# Patient Record
Sex: Female | Born: 1980 | Race: Black or African American | Hispanic: No | Marital: Single | Smoking: Current every day smoker
Health system: Southern US, Community
[De-identification: ages and names within clinical notes are randomized; demographics above are authoritative.]

## PROBLEM LIST (undated history)

## (undated) DIAGNOSIS — R519 Headache, unspecified: Secondary | ICD-10-CM

## (undated) HISTORY — DX: Headache, unspecified: R51.9

## (undated) HISTORY — PX: PANNICULECTOMY: SUR1001

---

## 2014-04-30 DIAGNOSIS — G4733 Obstructive sleep apnea (adult) (pediatric): Secondary | ICD-10-CM | POA: Insufficient documentation

## 2017-05-18 DIAGNOSIS — I10 Essential (primary) hypertension: Secondary | ICD-10-CM | POA: Insufficient documentation

## 2018-05-23 DIAGNOSIS — G43109 Migraine with aura, not intractable, without status migrainosus: Secondary | ICD-10-CM | POA: Insufficient documentation

## 2020-02-26 DIAGNOSIS — Z419 Encounter for procedure for purposes other than remedying health state, unspecified: Secondary | ICD-10-CM | POA: Diagnosis not present

## 2020-03-28 DIAGNOSIS — Z419 Encounter for procedure for purposes other than remedying health state, unspecified: Secondary | ICD-10-CM | POA: Diagnosis not present

## 2020-04-28 DIAGNOSIS — Z419 Encounter for procedure for purposes other than remedying health state, unspecified: Secondary | ICD-10-CM | POA: Diagnosis not present

## 2020-05-28 DIAGNOSIS — Z419 Encounter for procedure for purposes other than remedying health state, unspecified: Secondary | ICD-10-CM | POA: Diagnosis not present

## 2020-06-28 DIAGNOSIS — Z419 Encounter for procedure for purposes other than remedying health state, unspecified: Secondary | ICD-10-CM | POA: Diagnosis not present

## 2020-07-28 DIAGNOSIS — Z419 Encounter for procedure for purposes other than remedying health state, unspecified: Secondary | ICD-10-CM | POA: Diagnosis not present

## 2020-08-28 DIAGNOSIS — Z419 Encounter for procedure for purposes other than remedying health state, unspecified: Secondary | ICD-10-CM | POA: Diagnosis not present

## 2020-09-10 DIAGNOSIS — I10 Essential (primary) hypertension: Secondary | ICD-10-CM | POA: Diagnosis not present

## 2020-09-10 DIAGNOSIS — M170 Bilateral primary osteoarthritis of knee: Secondary | ICD-10-CM | POA: Diagnosis not present

## 2020-09-10 DIAGNOSIS — Z0000 Encounter for general adult medical examination without abnormal findings: Secondary | ICD-10-CM | POA: Diagnosis not present

## 2020-09-28 DIAGNOSIS — Z419 Encounter for procedure for purposes other than remedying health state, unspecified: Secondary | ICD-10-CM | POA: Diagnosis not present

## 2020-10-25 DIAGNOSIS — Z20822 Contact with and (suspected) exposure to covid-19: Secondary | ICD-10-CM | POA: Diagnosis not present

## 2020-10-25 DIAGNOSIS — Z01812 Encounter for preprocedural laboratory examination: Secondary | ICD-10-CM | POA: Diagnosis not present

## 2020-10-25 DIAGNOSIS — E65 Localized adiposity: Secondary | ICD-10-CM | POA: Diagnosis not present

## 2020-10-26 DIAGNOSIS — Z419 Encounter for procedure for purposes other than remedying health state, unspecified: Secondary | ICD-10-CM | POA: Diagnosis not present

## 2020-11-01 DIAGNOSIS — G4730 Sleep apnea, unspecified: Secondary | ICD-10-CM | POA: Diagnosis not present

## 2020-11-01 DIAGNOSIS — M549 Dorsalgia, unspecified: Secondary | ICD-10-CM | POA: Diagnosis not present

## 2020-11-01 DIAGNOSIS — R634 Abnormal weight loss: Secondary | ICD-10-CM | POA: Diagnosis not present

## 2020-11-01 DIAGNOSIS — E65 Localized adiposity: Secondary | ICD-10-CM | POA: Diagnosis not present

## 2020-11-01 DIAGNOSIS — M6208 Separation of muscle (nontraumatic), other site: Secondary | ICD-10-CM | POA: Diagnosis not present

## 2020-11-01 DIAGNOSIS — I10 Essential (primary) hypertension: Secondary | ICD-10-CM | POA: Diagnosis not present

## 2020-11-01 DIAGNOSIS — L987 Excessive and redundant skin and subcutaneous tissue: Secondary | ICD-10-CM | POA: Diagnosis not present

## 2020-11-02 DIAGNOSIS — R634 Abnormal weight loss: Secondary | ICD-10-CM | POA: Diagnosis not present

## 2020-11-02 DIAGNOSIS — G4730 Sleep apnea, unspecified: Secondary | ICD-10-CM | POA: Diagnosis not present

## 2020-11-02 DIAGNOSIS — M549 Dorsalgia, unspecified: Secondary | ICD-10-CM | POA: Diagnosis not present

## 2020-11-02 DIAGNOSIS — L987 Excessive and redundant skin and subcutaneous tissue: Secondary | ICD-10-CM | POA: Diagnosis not present

## 2020-11-26 DIAGNOSIS — Z419 Encounter for procedure for purposes other than remedying health state, unspecified: Secondary | ICD-10-CM | POA: Diagnosis not present

## 2020-12-26 DIAGNOSIS — Z419 Encounter for procedure for purposes other than remedying health state, unspecified: Secondary | ICD-10-CM | POA: Diagnosis not present

## 2021-01-26 DIAGNOSIS — Z419 Encounter for procedure for purposes other than remedying health state, unspecified: Secondary | ICD-10-CM | POA: Diagnosis not present

## 2021-02-25 DIAGNOSIS — Z419 Encounter for procedure for purposes other than remedying health state, unspecified: Secondary | ICD-10-CM | POA: Diagnosis not present

## 2021-03-28 DIAGNOSIS — Z419 Encounter for procedure for purposes other than remedying health state, unspecified: Secondary | ICD-10-CM | POA: Diagnosis not present

## 2021-04-28 DIAGNOSIS — Z419 Encounter for procedure for purposes other than remedying health state, unspecified: Secondary | ICD-10-CM | POA: Diagnosis not present

## 2021-05-28 DIAGNOSIS — Z419 Encounter for procedure for purposes other than remedying health state, unspecified: Secondary | ICD-10-CM | POA: Diagnosis not present

## 2021-06-28 DIAGNOSIS — Z419 Encounter for procedure for purposes other than remedying health state, unspecified: Secondary | ICD-10-CM | POA: Diagnosis not present

## 2021-07-28 DIAGNOSIS — Z419 Encounter for procedure for purposes other than remedying health state, unspecified: Secondary | ICD-10-CM | POA: Diagnosis not present

## 2021-08-28 DIAGNOSIS — Z419 Encounter for procedure for purposes other than remedying health state, unspecified: Secondary | ICD-10-CM | POA: Diagnosis not present

## 2021-09-28 DIAGNOSIS — Z419 Encounter for procedure for purposes other than remedying health state, unspecified: Secondary | ICD-10-CM | POA: Diagnosis not present

## 2021-10-26 DIAGNOSIS — Z419 Encounter for procedure for purposes other than remedying health state, unspecified: Secondary | ICD-10-CM | POA: Diagnosis not present

## 2021-11-01 ENCOUNTER — Ambulatory Visit (INDEPENDENT_AMBULATORY_CARE_PROVIDER_SITE_OTHER): Payer: Medicaid Other | Admitting: Advanced Practice Midwife

## 2021-11-01 ENCOUNTER — Other Ambulatory Visit: Payer: Self-pay

## 2021-11-01 VITALS — BP 134/89 | HR 72 | Wt 197.7 lb

## 2021-11-01 DIAGNOSIS — Z3200 Encounter for pregnancy test, result unknown: Secondary | ICD-10-CM

## 2021-11-01 DIAGNOSIS — Z3201 Encounter for pregnancy test, result positive: Secondary | ICD-10-CM | POA: Diagnosis not present

## 2021-11-01 DIAGNOSIS — Z3490 Encounter for supervision of normal pregnancy, unspecified, unspecified trimester: Secondary | ICD-10-CM

## 2021-11-01 DIAGNOSIS — Z3687 Encounter for antenatal screening for uncertain dates: Secondary | ICD-10-CM

## 2021-11-01 LAB — POCT PREGNANCY, URINE: Preg Test, Ur: POSITIVE — AB

## 2021-11-01 NOTE — Progress Notes (Signed)
?  History:  ?[name] is a 41 y.o. [id number] who presents to clinic today with complaint of possible pregnancy.  This is a big surprise and she is intermittently tearful during her visit. She denies physical complaints or concerns. She intends to pursue prenatal care with [facility]. ? ?The following portions of the patient's history were reviewed and updated as appropriate: allergies, current medications, past family history, past medical history, past social history, past surgical history and problem list.  ? ?Review of Systems:  ?Pertinent items noted in HPI and remainder of comprehensive ROS otherwise negative. ? ?Objective:  ?Physical Exam ?BP 134/89   Pulse 72   Wt 197 lb 11.2 oz (89.7 kg)   LMP 08/27/2021 (Approximate)  ?Physical Exam ?Vitals and nursing note reviewed. Exam conducted with a chaperone present.  ?Constitutional:   ?   Appearance: Normal appearance.  ?Cardiovascular:  ?   Rate and Rhythm: Normal rate.  ?Pulmonary:  ?   Effort: Pulmonary effort is normal.  ?Neurological:  ?   Mental Status: She is alert.  ? ?Labs and Imaging ?Results for orders placed or performed in visit on 11/01/21 (from the past 24 hour(s))  ?Pregnancy, urine POC     Status: Abnormal  ? Collection Time: 11/01/21 10:02 AM  ?Result Value Ref Range  ? Preg Test, Ur POSITIVE (A) NEGATIVE  ? ? ?No results found. ? ? ?Assessment & Plan:  ?1. Pregnancy with uncertain dates, antepartum ?- No complaints or concerns ?- US OB LESS THAN 14 WEEKS WITH OB TRANSVAGINAL; Future ? ?2. Possible pregnancy ?- Positive UPT ?- Pregnancy, urine POC ? ? ? ?Approximately 15 minutes of total time was spent with this patient on history taking, coordination of care, education and documentation.  ? ?Clayton Bibles, MSA, MSN, CNM ?Certified Nurse Midwife, Faculty Practice ?Center for [employer], [facility] ? ?

## 2021-11-01 NOTE — Patient Instructions (Signed)

## 2021-11-01 NOTE — Progress Notes (Signed)
Possible Pregnancy ? ?Here today for pregnancy confirmation. UPT in office today is positive. Pt reports first positive home UPT about 1 week ago. Reviewed dating with patient. Reports UPT end of December, cannot recall specific date. Based on approx LMP, pt would be approx 9 w today. Dating ultrasound scheduled for 11/17/21. ? ?OB history reviewed; single, living c-section delivery 01/30/14, 2 prior miscarriages. Reviewed medications and allergies with patient; list of medications safe to take during pregnancy given. Patient to schedule new OB appts during check out. Will schedule to follow dating [place], will reschedule appts if patient is too early for new OB appts. Jeronimo Greaves, CNM to bedside for brief visit. ? ?Annabell Howells, RN ?11/01/2021  10:05 AM ? ?

## 2021-11-17 ENCOUNTER — Ambulatory Visit
Admission: RE | Admit: 2021-11-17 | Discharge: 2021-11-17 | Disposition: A | Discharge status: Home / Self Care | Payer: Medicaid Other | Source: Ambulatory Visit | Attending: Advanced Practice Midwife | Admitting: Advanced Practice Midwife

## 2021-11-17 ENCOUNTER — Telehealth: Payer: Self-pay

## 2021-11-17 ENCOUNTER — Other Ambulatory Visit: Payer: Self-pay | Admitting: Advanced Practice Midwife

## 2021-11-17 ENCOUNTER — Ambulatory Visit (INDEPENDENT_AMBULATORY_CARE_PROVIDER_SITE_OTHER): Payer: Medicaid Other | Admitting: *Deleted

## 2021-11-17 ENCOUNTER — Telehealth: Payer: Self-pay | Admitting: Advanced Practice Midwife

## 2021-11-17 ENCOUNTER — Other Ambulatory Visit: Payer: Self-pay

## 2021-11-17 ENCOUNTER — Encounter: Payer: Self-pay | Admitting: Obstetrics and Gynecology

## 2021-11-17 VITALS — BP 177/104 | HR 73 | Ht 62.0 in | Wt 199.7 lb

## 2021-11-17 DIAGNOSIS — O208 Other hemorrhage in early pregnancy: Secondary | ICD-10-CM | POA: Insufficient documentation

## 2021-11-17 DIAGNOSIS — O169 Unspecified maternal hypertension, unspecified trimester: Secondary | ICD-10-CM

## 2021-11-17 DIAGNOSIS — Z3687 Encounter for antenatal screening for uncertain dates: Secondary | ICD-10-CM | POA: Insufficient documentation

## 2021-11-17 DIAGNOSIS — Z3A09 9 weeks gestation of pregnancy: Secondary | ICD-10-CM | POA: Diagnosis not present

## 2021-11-17 DIAGNOSIS — Z3490 Encounter for supervision of normal pregnancy, unspecified, unspecified trimester: Secondary | ICD-10-CM

## 2021-11-17 MED ORDER — NIFEDIPINE ER OSMOTIC RELEASE 30 MG PO TB24: 30.0000 mg | ORAL_TABLET | Freq: Every day | ORAL | 0 refills | Status: DC

## 2021-11-17 NOTE — Progress Notes (Signed)
Here for nurse visit for [place] results. Reviewed [place] with Dr. Shawnie Pons and informed patient US shows live baby with EDD 06/18/22. Also informed patient shows small subchorionic hemorrhage and she may see spotting, this is associated with higher risk of miscarriage. Also informed her FHR 197 and not sure why, higher than normal. She reports she was very nervous in [place] and thinks her heart rate was elevated. Advised to start prenatal care asap, and to start prenatal vitamins. List of providers placed in summary. ?BP elevated today 158/103. Denies hx HTN, states BP always elevated, always gets anxious in doctors office. Repeat BP 177/104. I asked if she has been having headaches, only once lately and was yesterday and it went away on its own. ?Discussed BP's and assessment with Dr. Alvester Morin. Orders for procardia and bp check one week. Advised patient and patient teary. Support given and discussed risks of HTN to her and her baby. Advised since she has new ob intake scheduled 11/24/21 we can change to in person so we can do bp recheck. She voices understanding. ?Nancy Fetter ?

## 2021-11-17 NOTE — Telephone Encounter (Signed)
Patient called at home, identity confirmed x 2. Reviewed ultrasound results including dating, fetal tachycardia, subchorionic hemorrhage and indication for pelvic rest. Patient denies questions or concerns at end of call. Virtual New OB intake 03/30 ? ?Clayton Bibles, MSA, MSN, CNM ?Certified Nurse Midwife, Faculty Practice ?Center for [employer], [facility] ? ?

## 2021-11-17 NOTE — Patient Instructions (Signed)
Prenatal Care Providers           [facility]  [address] [phone]  [facility]   [address]  [phone]  [facility] [place]       [address] [phone]            [facility] [place]     [address] [phone]          [facility] [place]   [address] [phone]  [facility]  [address] [phone]     [facility]  [address]   [phone]     [facility]  Phone: [phone]  [facility]  Phone: [phone]  [facility] Phone: [phone]  [facility] Phone: [phone]  [facility] Phone: [phone]  [facility] Phone: [phone]  [facility]  Phone: [phone]  

## 2021-11-17 NOTE — Telephone Encounter (Signed)
Returned Call to [facility] regarding Receiving her Stat U/S REPORT. ?

## 2021-11-24 ENCOUNTER — Ambulatory Visit (INDEPENDENT_AMBULATORY_CARE_PROVIDER_SITE_OTHER): Payer: Medicaid Other

## 2021-11-24 ENCOUNTER — Ambulatory Visit: Payer: Medicaid Other

## 2021-11-24 VITALS — BP 121/84 | HR 98 | Wt 198.8 lb

## 2021-11-24 DIAGNOSIS — Z0130 Encounter for examination of blood pressure without abnormal findings: Secondary | ICD-10-CM

## 2021-11-24 NOTE — Progress Notes (Signed)
Pt here today for BP check after starting Procardia 30 mg po daily for elevated pressures at nurse visit on 11/17/21.   Pt denies headache and visual changes. Last dose of Procardia was last night.  BP LA 121/84.  Pt advised to continue to take her Procardia as prescribed, to call the office with concerns/questions, and that we will evaluate her at NEW OB intake on 12/08/21.  Pt verbalized understanding with no further questions.  ? ?Luisangel Wainright,RN  ?11/25/21 ?

## 2021-11-26 DIAGNOSIS — Z419 Encounter for procedure for purposes other than remedying health state, unspecified: Secondary | ICD-10-CM | POA: Diagnosis not present

## 2021-12-01 ENCOUNTER — Encounter: Payer: Self-pay | Admitting: Obstetrics and Gynecology

## 2021-12-08 ENCOUNTER — Other Ambulatory Visit (HOSPITAL_COMMUNITY)
Admission: RE | Admit: 2021-12-08 | Discharge: 2021-12-08 | Disposition: A | Discharge status: Home / Self Care | Payer: Medicaid Other | Source: Ambulatory Visit | Attending: Family Medicine | Admitting: Family Medicine

## 2021-12-08 ENCOUNTER — Ambulatory Visit (INDEPENDENT_AMBULATORY_CARE_PROVIDER_SITE_OTHER): Payer: Medicaid Other

## 2021-12-08 DIAGNOSIS — I10 Essential (primary) hypertension: Secondary | ICD-10-CM

## 2021-12-08 DIAGNOSIS — O0990 Supervision of high risk pregnancy, unspecified, unspecified trimester: Secondary | ICD-10-CM | POA: Diagnosis not present

## 2021-12-08 DIAGNOSIS — O09529 Supervision of elderly multigravida, unspecified trimester: Secondary | ICD-10-CM | POA: Insufficient documentation

## 2021-12-08 DIAGNOSIS — O10919 Unspecified pre-existing hypertension complicating pregnancy, unspecified trimester: Secondary | ICD-10-CM | POA: Insufficient documentation

## 2021-12-08 DIAGNOSIS — O09521 Supervision of elderly multigravida, first trimester: Secondary | ICD-10-CM

## 2021-12-08 NOTE — Progress Notes (Signed)
Today Pt BP readings : ?142/95-R ?132/91-L ?Pt currently taking Nifedipine-30 mg, last taken last night.Spoke with Ralene Bathe, RN and Diane Day, RN regarding readings and they stated due to 2nd reading being lower, pt should be fine to waite until New OB to get checked again. ?

## 2021-12-08 NOTE — Patient Instructions (Addendum)
AREA PEDIATRIC/FAMILY PRACTICE PHYSICIANS ? ?[place] ([address]) ?[facility] ?Chambliss, MD; Eniola, MD; Hale, MD; Hensel, MD; McDiarmid, MD; McIntyer, MD; Tanina Barb, MD; Walden, MD ?[address] [place], [place] [address] ?[phone] ?Mon-Fri 8:30-12:30, 1:30-5:00 ?Providers come to see babies at [facility] ?Accepting Medicaid ?[facility] ?Limited providers who accept newborns: Koirala, MD; Morrow, MD; Wolters, MD ?[address], [place], [place] [address] ?[phone] ?Mon-Fri 8:00-5:30 ?Babies seen by providers at [facility] ?Does NOT accept Medicaid ?Please call early in hospitalization for appointment (limited availability)  ?[facility] ?Mulberry, MD ?[address] [place], [place] [address] ?[phone] ?Mon, Tue, Thur, Fri 8:30-5:00, Wed 10:00-7:00 (closed 1-2pm) ?Babies seen by [facility] providers ?Accepting Medicaid ?[facility] ?Rubin, MD ?[address], [place], [place] [address] ?[phone] ?Mon-Fri 8:30-5:00, Sat 8:30-12:00 ?Provider comes to see babies at [facility] ?Accepting Medicaid ?Must have been referred from current patients or contacted office prior to delivery ?[facility] ([facility]) ?Brown, MD; Chandler, MD; Ettefagh, MD; Grant, MD; Lester, MD; McCormick, MD; McQueen, MD; Prose, MD; Simha, MD; Stanley, MD; Stryffeler, NP; Tebben, NP ?[address], [place], [place] [address] ?[phone] ?Mon, Tue, Thur, Fri 8:30-5:30, Wed 9:30-5:30, Sat 8:30-12:30 ?Babies seen by [facility] providers ?Accepting Medicaid ?Only accepting infants of first-time parents or siblings of current patients ?Hospital discharge coordinator will make follow-up appointment ?Jack Amos ?[address], [place], [place]  [address] ?[phone]   Fax - [phone] ?[facility] ?[address], [place], [place]  [address] ?Phone - [phone]   Fax - [phone] ?Shilpa Gosrani ?[address], [place], [place]  [address] ?[phone] ? ?[place] ([address]) ? [facility] ?Bates, MD; [place], MD; Cooper, Cox, MD; MD; Davis, MD; Dovico, MD; Ettefaugh, MD; Little, MD; Lowe, MD; Keiffer, MD; Melvin, MD; Sumner, MD; Williams, MD ?[address], [place], [place] [address] ?[phone] ?Mon-Fri 8:30-5:00 (extended evenings Mon-Thur as needed), Sat-Sun 10:00-1:00 ?Providers come to see babies at [facility] ?Accepting Medicaid for families of first-time babies and families with all children in the household age 3 and under. Must register with office prior to making appointment (M-F only). ?[facility] ?Henson, NP; Knapp, MD; Lalonde, MD; Tysinger, PA ?[address] [place], [place] [address] ?[phone] ?Mon-Fri 8:00-5:00 ?Babies seen by providers at [facility] ?Does NOT accept Medicaid/Commercial Insurance Only ?[facility] ([facility])  ?Artis, MD; Barnes, MD; Bratton, MD; Coccaro, MD; Lockett Gardner, MD; Kramer, MD; Marshall, MD; Netherton, MD; Poleto, MD; Skinner, MD ?[address] [place], [place] [address] ?[phone] ?Mon-Fri 8:30-5:30, Sat (Oct.-Mar.) 9:00-1:00 ?Babies seen by providers at [facility] ?Accepting Medicaid ? ?[place] ([address]) ?[facility] ?Reid, MD; Warner, MD ?[address], [place],  [address] ?[phone] ?Mon-Fri 8:30-5:00, Sat 8:30-12:00 ?Providers come to see babies at [facility] ?Does NOT accept Medicaid ?[facility] ?Becker, PA; Hagler, MD; Scifres, PA; Sun, MD; Swayne, MD ?[address], ,  [address] ?[phone] ?Mon-Fri 8:00-5:00 ?Babies seen by providers at [facility] ?Does NOT accept Medicaid ?Only accepting babies of parents who  are patients ?Please call early in hospitalization for appointment (limited availability) ? [facility] ?Clark, MD; Frye, MD; Kelleher, MD; Mack, NP; Miller, MD; O'Keller, MD; Patterson, NP; Pudlo, MD; Puzio, MD; Thomas, MD; Tucker, MD; Twiselton, MD ?[address], [place], [place] [address] ?[phone] ?Mon-Fri 8:00-5:00, Sat 9:00-12:00 ?Providers come to see babies at [facility] ?Does NOT accept Medicaid ? ?[place] ([address]) ?[facility] ?Limited providers accepting new patients: Brake, NP; Wharton, PA ?[address], [place], [place] [address] ?[phone] ?Mon-Fri 8:00-5:00 ?Babies seen by providers at [facility] ?Does NOT accept Medicaid ?Only accepting babies of parents who are patients ?Please call early in hospitalization for appointment (limited availability) ?[facility] ?Gay, MD; Quinlan, MD ?[address] [place], [place] [address] ?[phone] (press [phone] to schedule appointment) ?Mon-Fri 8:00-5:00 ?Providers come to see babies at [facility] ?Does NOT accept Medicaid ?[facility] ?Mazer, MD ?[address] [place], [place] [address] ?[phone] ?Mon-Fri 8:30-5:00 (lunch 12:30-1:00), extended hours by appointment only Wed 5:00-6:30 ?Babies seen by [facility] providers ?Accepting Medicaid ?[facility] ?Banks, MD; Jordan, MD; Koberlein, MD ?[address], [place], [place] [address] ?[phone] ?Mon-Fri 8:00-5:00 ?Babies seen by [facility] providers ?Does NOT accept Medicaid ?[facility] ?Parker, MD; Hunter, MD; Wallace, DO ?[address] [place], [place] [address] ?[phone] ?Mon-Fri 8:00-5:00 ?Babies seen by [facility] providers ?Does NOT accept Medicaid ?[facility] ?Brandon, PA; Brecken, PA; Christy, NP; Dees, MD; DeClaire, MD; DeWeese, MD; Hansen, NP; Mills, NP; Parrish, NP; Smoot, NP; Summer, MD; Vapne,  MD ?[address] [place], [place] [address] ?[phone] ?Mon-Fri 8:30-5:00, Sat 10:00-1:00 ?Providers come to see babies at [facility] ?Does NOT accept Medicaid ?Free prenatal information session Tuesdays at 4:45pm ?[facility] ?Bouska, MD; Gordon, PA; Jeffery, PA; Weber, PA ?[address] [place] [address] ?[phone] ?Mon-Fri 7:30-5:30 ?Babies seen by [facility] providers ?[facility] ?[address], [place], [place]  [address] ?[phone]   Fax - [phone] ? ?[place] ([address]) ?[facility] ?Reese, MD ?[address] [place], [place] [address] ?[phone] ?Mon-Thur 8:00-6:00 ?Providers come to see babies at [facility] ?Accepting Medicaid ?[facility] ?Anderson, NP; Badger, MD; Beal, PA; Spencer, PA ?[address] [place], [place] [address] ?[phone] ?Mon-Thur 7:30-7:30, Fri 7:30-4:30 ?Babies seen by [facility] providers ?Accepting Medicaid ?[facility] ?Agbuya, MD; Klett, NP; Romgoolam, MD ?[address], [place], [place] [address] ?[phone] ?Mon-Fri 8:30-5:00, Sat 8:30-12:00 ?Providers come to see babies at [facility] ?Accepting Medicaid ?Must have ?Meet & Greet? appointment at office prior to delivery ?[facility] ([facility]) ?McCord, MD; Wallace, MD; Wood, MD ?[address], [place], [place] [address] ?[phone] ?Mon-Wed 8:00-6:00, Thur-Fri 8:00-5:00, Sat 9:00-12:00 ?Providers come to see babies at [facility] ?Does NOT accept Medicaid ?Only accepting siblings of current patients ?[facility]  ?[address], [place], [place]  [address] ?[phone]   Fax - [phone] ?[facility] ?[address], [place],   [address] ?[phone]   Fax -  [phone] ? ?[place] ([address]) ? [facility] ?Cirigliano, DO; Matthews, DO ?[address] [place],  [address] ?[phone] ?Mon-Fri 7:00-5:00 ?Babies seen by [place]

## 2021-12-08 NOTE — Assessment & Plan Note (Deleted)
Nifedipine 30 mg

## 2021-12-08 NOTE — Progress Notes (Signed)
New OB Intake ? ?I connected with  Rutherford Limerick on 12/08/21 at  9:15 AM EDT by In Person Visit and verified that I am speaking with the correct person using two identifiers. Nurse is located at [facility] and pt is located at [employer]. ? ?I discussed the limitations, risks, security and privacy concerns of performing an evaluation and management service by telephone and the availability of in person appointments. I also discussed with the patient that there may be a patient responsible charge related to this service. The patient expressed understanding and agreed to proceed. ? ?I explained I am completing New OB Intake today. We discussed her EDD of 06/18/22 that is based on U/S on 11/17/21. Pt is G4/P1. I reviewed her allergies, medications, Medical/Surgical/OB history, and appropriate screenings. I informed her of [facility] services. Based on history, this is a/an  pregnancy uncomplicated .  ? ?Patient Active Problem List  ? Diagnosis Date Noted  ? Supervision of high risk pregnancy, antepartum 12/08/2021  ? AMA (advanced maternal age) multigravida 35+ 12/08/2021  ? Migraine with aura and without status migrainosus, not intractable 05/23/2018  ? Benign essential hypertension 05/18/2017  ? OSA (obstructive sleep apnea) 04/30/2014  ? ? ?Concerns addressed today ? ?Delivery Plans:  ?Plans to deliver at [facility].  ? ?MyChart/Babyscripts ?MyChart access verified. I explained pt will have some visits in office and some virtually. Babyscripts instructions given and order placed. Patient verifies receipt of registration text/e-mail. Account successfully created and app downloaded. ? ?Blood Pressure Cuff  ?Has own BP Cuff, Explained after first prenatal appt pt will check weekly and document in Babyscripts. ? ?Weight scale: Patient does / does not  have weight scale. Weight scale ordered for patient to pick up from [facility].  ? ?Anatomy US ?Explained first scheduled [place] will be around 19 weeks. Anatomy US scheduled for 01/24/22  at 0930. Pt notified to arrive at 0915. ?Scheduled AFP lab only appointment if CenteringPregnancy pt for same day as anatomy US.  ? ?Labs ?Discussed Avelina Laine genetic screening with patient. Would like both Panorama and Horizon drawn at new OB visit.Also if interested in genetic testing, tell patient she will need AFP 15-21 weeks to complete genetic testing .Routine prenatal labs needed. ? ?Covid Vaccine ?Patient has not covid vaccine.  ? ?Is patient a CenteringPregnancy candidate? Declined due to Due to work schedule   "Centering Patient" indicated on sticky note ?  ?Is patient a Mom+Baby Combined Care candidate? Not a candidate   Scheduled with Mom+Baby provider  ?  ?Is patient interested in [place]? No  "Interested in [employer] - Schedule next visit with CNM" on sticky note ? ?Informed patient of [facility] website  and placed link in her AVS.  ? ?Social Determinants of Health ?Food Insecurity: Patient denies food insecurity. ?WIC Referral: Patient is interested in referral to [facility].  ?Transportation: Patient denies transportation needs. ?Childcare: Discussed no children allowed at ultrasound appointments. Offered childcare services; patient declines childcare services at this time. ? ?Send link to Pregnancy Navigators ? ? ?Placed OB Box on problem list and updated ? ?First visit review ?I reviewed new OB appt with pt. I explained she will have a pelvic exam, ob bloodwork with genetic screening, and PAP smear. Explained pt will be seen by Dr. Crissie Reese at first visit; encounter routed to appropriate provider. Explained that patient will be seen by pregnancy navigator following visit with provider. [facility] information placed in AVS.  ? ?Henrietta Dine, CMA ?12/08/2021  9:43 AM  ?

## 2021-12-09 LAB — CBC/D/PLT+RPR+RH+ABO+RUBIGG...
Antibody Screen: NEGATIVE
Basophils Absolute: 0.1 10*3/uL (ref 0.0–0.2)
Basos: 1 %
EOS (ABSOLUTE): 0.2 10*3/uL (ref 0.0–0.4)
Eos: 2 %
HCV Ab: NONREACTIVE
HIV Screen 4th Generation wRfx: NONREACTIVE
Hematocrit: 40.4 % (ref 34.0–46.6)
Hemoglobin: 13.4 g/dL (ref 11.1–15.9)
Hepatitis B Surface Ag: NEGATIVE
Immature Grans (Abs): 0 10*3/uL (ref 0.0–0.1)
Immature Granulocytes: 0 %
Lymphocytes Absolute: 2 10*3/uL (ref 0.7–3.1)
Lymphs: 28 %
MCH: 29.5 pg (ref 26.6–33.0)
MCHC: 33.2 g/dL (ref 31.5–35.7)
MCV: 89 fL (ref 79–97)
Monocytes Absolute: 0.9 10*3/uL (ref 0.1–0.9)
Monocytes: 12 %
Neutrophils Absolute: 4.2 10*3/uL (ref 1.4–7.0)
Neutrophils: 57 %
Platelets: 301 10*3/uL (ref 150–450)
RBC: 4.54 x10E6/uL (ref 3.77–5.28)
RDW: 14.5 % (ref 11.7–15.4)
RPR Ser Ql: NONREACTIVE
Rh Factor: POSITIVE
Rubella Antibodies, IGG: 2.31 index (ref >0.99)
WBC: 7.3 10*3/uL (ref 3.4–10.8)

## 2021-12-09 LAB — GC/CHLAMYDIA PROBE AMP (~~LOC~~) NOT AT ARMC
Chlamydia: NEGATIVE
Comment: NEGATIVE
Comment: NORMAL
Neisseria Gonorrhea: NEGATIVE

## 2021-12-09 LAB — TSH: TSH: 0.951 u[IU]/mL (ref 0.450–4.500)

## 2021-12-09 LAB — HEMOGLOBIN A1C
Est. average glucose Bld gHb Est-mCnc: 111 mg/dL
Hgb A1c MFr Bld: 5.5 % (ref 4.8–5.6)

## 2021-12-09 LAB — HCV INTERPRETATION

## 2021-12-10 LAB — PROTEIN / CREATININE RATIO, URINE
Creatinine, Urine: 82.9 mg/dL
Protein, Ur: 9.1 mg/dL
Protein/Creat Ratio: 110 mg/g creat (ref 0–200)

## 2021-12-10 LAB — CULTURE, OB URINE

## 2021-12-10 LAB — URINE CULTURE, OB REFLEX

## 2021-12-13 ENCOUNTER — Encounter: Payer: Self-pay | Admitting: Family Medicine

## 2021-12-19 ENCOUNTER — Ambulatory Visit (INDEPENDENT_AMBULATORY_CARE_PROVIDER_SITE_OTHER): Payer: Medicaid Other | Admitting: Family Medicine

## 2021-12-19 ENCOUNTER — Encounter: Payer: Self-pay | Admitting: Family Medicine

## 2021-12-19 VITALS — BP 127/88 | HR 90 | Wt 205.5 lb

## 2021-12-19 DIAGNOSIS — O0990 Supervision of high risk pregnancy, unspecified, unspecified trimester: Secondary | ICD-10-CM

## 2021-12-19 DIAGNOSIS — O10919 Unspecified pre-existing hypertension complicating pregnancy, unspecified trimester: Secondary | ICD-10-CM

## 2021-12-19 DIAGNOSIS — Z3A14 14 weeks gestation of pregnancy: Secondary | ICD-10-CM

## 2021-12-19 DIAGNOSIS — O09522 Supervision of elderly multigravida, second trimester: Secondary | ICD-10-CM

## 2021-12-19 DIAGNOSIS — O169 Unspecified maternal hypertension, unspecified trimester: Secondary | ICD-10-CM

## 2021-12-19 DIAGNOSIS — Z98891 History of uterine scar from previous surgery: Secondary | ICD-10-CM

## 2021-12-19 DIAGNOSIS — G43109 Migraine with aura, not intractable, without status migrainosus: Secondary | ICD-10-CM

## 2021-12-19 MED ORDER — NIFEDIPINE ER OSMOTIC RELEASE 30 MG PO TB24: 30.0000 mg | ORAL_TABLET | Freq: Every day | ORAL | 11 refills | Status: DC

## 2021-12-19 MED ORDER — ASPIRIN EC 81 MG PO TBEC: 81.0000 mg | DELAYED_RELEASE_TABLET | Freq: Every day | ORAL | 11 refills | Status: DC

## 2021-12-19 NOTE — Patient Instructions (Signed)
Second Trimester of Pregnancy ? ?The second trimester of pregnancy is from week 13 through week 27. This is months 4 through 6 of pregnancy. The second trimester is often a time when you feel your best. Your body has adjusted to being pregnant, and you begin to feel better physically. ?During the second trimester: ?Morning sickness has lessened or stopped completely. ?You may have more energy. ?You may have an increase in appetite. ?The second trimester is also a time when the unborn baby (fetus) is growing rapidly. At the end of the sixth month, the fetus may be up to 12 inches long and weigh about 1? pounds. You will likely begin to feel the baby move (quickening) between 16 and 20 weeks of pregnancy. ?Body changes during your second trimester ?Your body continues to go through many changes during your second trimester. The changes vary and generally return to normal after the baby is born. ?Physical changes ?Your weight will continue to increase. You will notice your lower abdomen bulging out. ?You may begin to get stretch marks on your hips, abdomen, and breasts. ?Your breasts will continue to grow and to become tender. ?Dark spots or blotches (chloasma or mask of pregnancy) may develop on your face. ?A dark line from your belly button to the pubic area (linea nigra) may appear. ?You may have changes in your hair. These can include thickening of your hair, rapid growth, and changes in texture. Some people also have hair loss during or after pregnancy, or hair that feels dry or thin. ?Health changes ?You may develop headaches. ?You may have heartburn. ?You may develop constipation. ?You may develop hemorrhoids or swollen, bulging veins (varicose veins). ?Your gums may bleed and may be sensitive to brushing and flossing. ?You may urinate more often because the fetus is pressing on your bladder. ?You may have back pain. This is caused by: ?Weight gain. ?Pregnancy hormones that are relaxing the joints in your  pelvis. ?A shift in weight and the muscles that support your balance. ?Follow these instructions at home: ?Medicines ?Follow your health care provider's instructions regarding medicine use. Specific medicines may be either safe or unsafe to take during pregnancy. Do not take any medicines unless approved by your health care provider. ?Take a prenatal vitamin that contains at least 600 micrograms (mcg) of folic acid. ?Eating and drinking ?Eat a healthy diet that includes fresh fruits and vegetables, whole grains, good sources of protein such as meat, eggs, or tofu, and low-fat dairy products. ?Avoid raw meat and unpasteurized juice, milk, and cheese. These carry germs that can harm you and your baby. ?You may need to take these actions to prevent or treat constipation: ?Drink enough fluid to keep your urine pale yellow. ?Eat foods that are high in fiber, such as beans, whole grains, and fresh fruits and vegetables. ?Limit foods that are high in fat and processed sugars, such as fried or sweet foods. ?Activity ?Exercise only as directed by your health care provider. Most people can continue their usual exercise routine during pregnancy. Try to exercise for 30 minutes at least 5 days a week. Stop exercising if you develop contractions in your uterus. ?Stop exercising if you develop pain or cramping in the lower abdomen or lower back. ?Avoid exercising if it is very hot or humid or if you are at a high altitude. ?Avoid heavy lifting. ?If you choose to, you may have sex unless your health care provider tells you not to. ?Relieving pain and discomfort ?Wear a supportive   bra to prevent discomfort from breast tenderness. ?Take warm sitz baths to soothe any pain or discomfort caused by hemorrhoids. Use hemorrhoid cream if your health care provider approves. ?Rest with your legs raised (elevated) if you have leg cramps or low back pain. ?If you develop varicose veins: ?Wear support hose as told by your health care  provider. ?Elevate your feet for 15 minutes, 3-4 times a day. ?Limit salt in your diet. ?Safety ?Wear your seat belt at all times when driving or riding in a car. ?Talk with your health care provider if someone is verbally or physically abusive to you. ?Lifestyle ?Do not use hot tubs, steam rooms, or saunas. ?Do not douche. Do not use tampons or scented sanitary pads. ?Avoid cat litter boxes and soil used by cats. These carry germs that can cause birth defects in the baby and possibly loss of the fetus by miscarriage or stillbirth. ?Do not use herbal remedies, alcohol, illegal drugs, or medicines that are not approved by your health care provider. Chemicals in these products can harm your baby. ?Do not use any products that contain nicotine or tobacco, such as cigarettes, e-cigarettes, and chewing tobacco. If you need help quitting, ask your health care provider. ?General instructions ?During a routine prenatal visit, your health care provider will do a physical exam and other tests. He or she will also discuss your overall health. Keep all follow-up visits. This is important. ?Ask your health care provider for a referral to a local prenatal education class. ?Ask for help if you have counseling or nutritional needs during pregnancy. Your health care provider can offer advice or refer you to specialists for help with various needs. ?Where to find more information ?American Pregnancy Association: americanpregnancy.org ?American College of Obstetricians and Gynecologists: acog.org/en/Womens%20Health/Pregnancy ?Office on Women's Health: womenshealth.gov/pregnancy ?Contact a health care provider if you have: ?A headache that does not go away when you take medicine. ?Vision changes or you see spots in front of your eyes. ?Mild pelvic cramps, pelvic pressure, or nagging pain in the abdominal area. ?Persistent nausea, vomiting, or diarrhea. ?A bad-smelling vaginal discharge or foul-smelling urine. ?Pain when you  urinate. ?Sudden or extreme swelling of your face, hands, ankles, feet, or legs. ?A fever. ?Get help right away if you: ?Have fluid leaking from your vagina. ?Have spotting or bleeding from your vagina. ?Have severe abdominal cramping or pain. ?Have difficulty breathing. ?Have chest pain. ?Have fainting spells. ?Have not felt your baby move for the time period told by your health care provider. ?Have new or increased pain, swelling, or redness in an arm or leg. ?Summary ?The second trimester of pregnancy is from week 13 through week 27 (months 4 through 6). ?Do not use herbal remedies, alcohol, illegal drugs, or medicines that are not approved by your health care provider. Chemicals in these products can harm your baby. ?Exercise only as directed by your health care provider. Most people can continue their usual exercise routine during pregnancy. ?Keep all follow-up visits. This is important. ?This information is not intended to replace advice given to you by your health care provider. Make sure you discuss any questions you have with your health care provider. ?Document Revised: 01/21/2020 Document Reviewed: 11/27/2019 ?Elsevier Patient Education ? 2023 [employer]vier Inc. ? ?Contraception Choices ?Contraception, also called birth control, refers to methods or devices that prevent pregnancy. ?Hormonal methods ? ?Contraceptive implant ?A contraceptive implant is a thin, plastic tube that contains a hormone that prevents pregnancy. It is different from an intrauterine device (  IUD). It is inserted into the upper part of the arm by a health care provider. Implants can be effective for up to 3 years. ?Progestin-only injections ?Progestin-only injections are injections of progestin, a synthetic form of the hormone progesterone. They are given every 3 months by a health care provider. ?Birth control pills ?Birth control pills are pills that contain hormones that prevent pregnancy. They must be taken once a day, preferably at  the same time each day. A prescription is needed to use this method of contraception. ?Birth control patch ?The birth control patch contains hormones that prevent pregnancy. It is placed on the skin and must be changed once a week

## 2021-12-19 NOTE — Progress Notes (Signed)
?  ? ?Subjective:  ? ?Heather Clements is a 41 y.o. G4P1021 at [redacted]w[redacted]d by 9 wk [place] being seen today for her first obstetrical visit.  Her obstetrical history is significant for advanced maternal age and chronic hypertension, AMA >33 y/o . Patient does intend to breast feed. Pregnancy history fully reviewed. ? ?Patient reports no complaints. ? ?HISTORY: ?OB History  ?Gravida Para Term Preterm AB Living  ?4 1 1  0 2 1  ?SAB IAB Ectopic Multiple Live Births  ?2 0 0 0 1  ?  ?# Outcome Date GA Lbr Len/2nd Weight Sex Delivery Anes PTL Lv  ?4 Current           ?3 SAB 10/2018          ?2 Term 01/30/14   6 lb (2.722 kg)  CS-LTranv     ?1 SAB 12/2005          ?  ? ?Last pap smear: ?No results found for: DIAGPAP, HPV, HPVHIGH ?Normal 2019, not due until 04/2023 ? ?History reviewed. No pertinent past medical history. ?Past Surgical History:  ?Procedure Laterality Date  ? CESAREAN SECTION    ? ?History reviewed. No pertinent family history. ?Social History  ? ?Tobacco Use  ? Smoking status: Former  ?  Types: Cigarettes  ?  Passive exposure: Never  ? Smokeless tobacco: Never  ?Vaping Use  ? Vaping Use: Never used  ?Substance Use Topics  ? Alcohol use: Not Currently  ? Drug use: Not Currently  ?  Types: Marijuana  ? ?Allergies  ?Allergen Reactions  ? Latex Itching  ? ?Current Outpatient Medications on File Prior to Visit  ?Medication Sig Dispense Refill  ? NIFEdipine (PROCARDIA-XL/NIFEDICAL-XL) 30 MG 24 hr tablet Take 1 tablet (30 mg total) by mouth daily. 30 tablet 0  ? Prenatal Vit-Fe Fumarate-FA (PREPLUS) 27-1 MG TABS Take 1 tablet by mouth daily.    ? ?No current facility-administered medications on file prior to visit.  ? ? ? ?Exam  ? ?Vitals:  ? 12/19/21 1035  ?BP: 127/88  ?Pulse: 90  ?Weight: 205 lb 8 oz (93.2 kg)  ? ?  ? ?System: General: well-developed, well-nourished female in no acute distress  ? Skin: normal coloration and turgor, no rashes  ? Neurologic: oriented, normal, negative, normal mood  ? Extremities: normal strength,  tone, and muscle mass, ROM of all joints is normal  ? HEENT PERRLA, extraocular movement intact and sclera clear, anicteric  ? Neck supple and no masses  ? Respiratory:  no respiratory distress  ? ? ?  ?Assessment:  ? ?Pregnancy: [id number] ?Patient Active Problem List  ? Diagnosis Date Noted  ? History of cesarean delivery 12/19/2021  ? Supervision of high risk pregnancy, antepartum 12/08/2021  ? AMA (advanced maternal age) multigravida 35+ 12/08/2021  ? Chronic hypertension affecting pregnancy 12/08/2021  ? Migraine with aura and without status migrainosus, not intractable 05/23/2018  ? OSA (obstructive sleep apnea) 04/30/2014  ? ?  ?Plan:  ?1. Supervision of high risk pregnancy, antepartum ?BP and FHR normal by [place] ?Very tearful initially, stressed about health of pregnancy, showed her [place] images and reassured her that everything appears normal ?Initial labs reviewed, all normal ?Continue prenatal vitamins. ?Genetic Screening discussed, NIPS: results pending. ?Ultrasound discussed; fetal anatomic survey: ordered. ?Problem list reviewed and updated. ?The nature of [facility] with multiple MDs and other Advanced Practice Providers was explained to patient; also emphasized that residents, students are part of our team. ? ?  2. Chronic hypertension affecting pregnancy ?On nifedipine 30 XL, well controlled ?Start prenatal ASA ? ?3. Multigravida of advanced maternal age in second trimester ?Will need increased antenatal testing ? ?4. History of cesarean delivery ?Op note in Care Everywhere on [id number], LTCS, no complications ?Leaning towards RCS+BTL ? ?5. Migraine with aura and without status migrainosus, not intractable ?Frequent migraines, last one the week prior ? ? ? ?Routine obstetric precautions reviewed. ?Return in 4 weeks (on 01/16/2022) for [facility], ob visit. ? ?  ? ?

## 2021-12-26 DIAGNOSIS — Z419 Encounter for procedure for purposes other than remedying health state, unspecified: Secondary | ICD-10-CM | POA: Diagnosis not present

## 2022-01-17 ENCOUNTER — Encounter: Payer: Self-pay | Admitting: Family Medicine

## 2022-01-17 ENCOUNTER — Ambulatory Visit (INDEPENDENT_AMBULATORY_CARE_PROVIDER_SITE_OTHER): Payer: Medicaid Other | Admitting: Family Medicine

## 2022-01-17 VITALS — BP 131/87 | HR 114 | Wt 206.7 lb

## 2022-01-17 DIAGNOSIS — O0990 Supervision of high risk pregnancy, unspecified, unspecified trimester: Secondary | ICD-10-CM

## 2022-01-17 DIAGNOSIS — G43109 Migraine with aura, not intractable, without status migrainosus: Secondary | ICD-10-CM

## 2022-01-17 DIAGNOSIS — O10919 Unspecified pre-existing hypertension complicating pregnancy, unspecified trimester: Secondary | ICD-10-CM

## 2022-01-17 DIAGNOSIS — O09522 Supervision of elderly multigravida, second trimester: Secondary | ICD-10-CM

## 2022-01-17 DIAGNOSIS — Z98891 History of uterine scar from previous surgery: Secondary | ICD-10-CM

## 2022-01-17 MED ORDER — PREPLUS 27-1 MG PO TABS: 1.0000 | ORAL_TABLET | Freq: Every day | ORAL | 11 refills | Status: AC

## 2022-01-17 NOTE — Progress Notes (Signed)
Patient requesting refills on prenatal, aspirin, and procardia.    Wynona Canes, CMA

## 2022-01-17 NOTE — Progress Notes (Signed)
   Subjective:  Heather Clements is a 41 y.o. G4P1021 at [redacted]w[redacted]d being seen today for ongoing prenatal care.  She is currently monitored for the following issues for this high-risk pregnancy and has OSA (obstructive sleep apnea); Migraine with aura and without status migrainosus, not intractable; Supervision of high risk pregnancy, antepartum; AMA (advanced maternal age) multigravida 35+; Chronic hypertension affecting pregnancy; and History of cesarean delivery on their problem list.  Patient reports no complaints.  Contractions: Not present. Vag. Bleeding: None.  Movement: Present. Denies leaking of fluid.   The following portions of the patient's history were reviewed and updated as appropriate: allergies, current medications, past family history, past medical history, past social history, past surgical history and problem list. Problem list updated.  Objective:   Vitals:   01/17/22 1112  BP: 131/87  Pulse: (!) 114  Weight: 206 lb 11.2 oz (93.8 kg)    Fetal Status: Fetal Heart Rate (bpm): 146   Movement: Present     General:  Alert, oriented and cooperative. Patient is in no acute distress.  Skin: Skin is warm and dry. No rash noted.   Cardiovascular: Normal heart rate noted  Respiratory: Normal respiratory effort, no problems with respiration noted  Abdomen: Soft, gravid, appropriate for gestational age. Pain/Pressure: Present     Pelvic: Vag. Bleeding: None     Cervical exam deferred        Extremities: Normal range of motion.  Edema: None  Mental Status: Normal mood and affect. Normal behavior. Normal judgment and thought content.   Urinalysis:      Assessment and Plan:  Pregnancy: G4P1021 at [redacted]w[redacted]d  1. Supervision of high risk pregnancy, antepartum BP and FHR normal AFP today Anatomy scan scheduled for next week  2. History of cesarean delivery Op note in Care Everywhere on A999333, LTCS, no complications Plans for RCS+BTL  3. Multigravida of advanced maternal age in second  trimester Will need antenatal testing  4. Chronic hypertension affecting pregnancy On Nifedipine 30 XL, well controlled On ASA  5. Migraine with aura and without status migrainosus, not intractable stable  Preterm labor symptoms and general obstetric precautions including but not limited to vaginal bleeding, contractions, leaking of fluid and fetal movement were reviewed in detail with the patient. Please refer to After Visit Summary for other counseling recommendations.  Return for [facility], ob visit.   Clarnce Flock, MD

## 2022-01-19 LAB — AFP, SERUM, OPEN SPINA BIFIDA
AFP MoM: 1.13
AFP Value: 47.2 ng/mL
Gest. Age on Collection Date: 18.2 weeks
Maternal Age At EDD: 43.7 yr
OSBR Risk 1 IN: 10000
Test Results:: NEGATIVE
Weight: 205 [lb_av]

## 2022-01-24 ENCOUNTER — Ambulatory Visit: Payer: Medicaid Other | Admitting: *Deleted

## 2022-01-24 ENCOUNTER — Other Ambulatory Visit: Payer: Self-pay | Admitting: *Deleted

## 2022-01-24 ENCOUNTER — Ambulatory Visit: Payer: Medicaid Other | Attending: Family Medicine

## 2022-01-24 ENCOUNTER — Encounter: Payer: Self-pay | Admitting: *Deleted

## 2022-01-24 ENCOUNTER — Ambulatory Visit: Payer: Medicaid Other | Attending: Obstetrics and Gynecology | Admitting: Obstetrics and Gynecology

## 2022-01-24 VITALS — BP 130/79 | HR 87

## 2022-01-24 DIAGNOSIS — Z3A19 19 weeks gestation of pregnancy: Secondary | ICD-10-CM

## 2022-01-24 DIAGNOSIS — O34219 Maternal care for unspecified type scar from previous cesarean delivery: Secondary | ICD-10-CM | POA: Diagnosis not present

## 2022-01-24 DIAGNOSIS — O0990 Supervision of high risk pregnancy, unspecified, unspecified trimester: Secondary | ICD-10-CM | POA: Diagnosis not present

## 2022-01-24 DIAGNOSIS — O10012 Pre-existing essential hypertension complicating pregnancy, second trimester: Secondary | ICD-10-CM

## 2022-01-24 DIAGNOSIS — O99212 Obesity complicating pregnancy, second trimester: Secondary | ICD-10-CM | POA: Diagnosis not present

## 2022-01-24 DIAGNOSIS — O10919 Unspecified pre-existing hypertension complicating pregnancy, unspecified trimester: Secondary | ICD-10-CM

## 2022-01-24 DIAGNOSIS — O09522 Supervision of elderly multigravida, second trimester: Secondary | ICD-10-CM

## 2022-01-24 NOTE — Progress Notes (Signed)
Maternal-Fetal Medicine   Name: Heather Clements DOB: 10-17-1980 MRN: [id number] Referring Provider: Merian Capron, MD  I had the pleasure of seeing [name] at [facility].  She is here for fetal anatomy scan. G4 P1. Advanced maternal age.  On cell-free fetal DNA screening, the risks of fetal aneuploidies are not increased. Obstetrical history significant for a term cesarean delivery (for failure to progress in labor) in 30 of a female infant weighing 6 pounds and 2 ounces at birth. Past medical history is significant for chronic hypertension for unknown duration.  Patient was not taking any antihypertensives before pregnancy.  She takes nifedipine XL 30 mg daily.  Blood pressure today at her office is 130/79 mmHg.  She takes low-dose aspirin prophylaxis.  Ultrasound We performed a fetal anatomy scan. No markers of aneuploidies or fetal structural defects are seen. Fetal biometry is consistent with her previously-established dates. Amniotic fluid is normal and good fetal activity is seen.  Placenta is anterior and there is no evidence of previa or placenta accreta spectrum. As maternal obesity imposes limitations on the resolution of images, fetal anomalies may be missed. Patient understands the limitations of ultrasound in detecting fetal anomalies.   Chronic hypertension -Adverse outcomes of severe chronic hypertension include maternal stroke, endorgan damage, coagulation disturbances. Placental abruption is more common. -Superimposed preeclampsia occurs in more than 30% of women with chronic hypertension I discussed the benefit of low-dose aspirin prophylaxis that helps delaying or preventing preeclampsia. -I discussed the safety profile of antihypertensives.  Nifedipine can be safely given in pregnancy.  Alternative medications include labetalol, methyldopa. -I discussed ultrasound protocol of monitoring fetal growth assessment and antenatal testing. -Timing of  delivery: Provided her blood pressures are well controlled, she can be delivered at 39 weeks' gestation.  Early term delivery is an option if hypertension is not well controlled.  Previous cesarean delivery I reassured the patient of normal placental location.  Repeat cesarean deliveries increase the risk of placenta previa or placenta accreta spectrum.  We discussed VBAC.  I counseled her that the risk of uterine scar rupture is about 1% if she attempts vaginal delivery.  Patient reports she is from all her decision to undergo repeat cesarean delivery.  Recommendations -An appointment was made for her to return in 4 weeks for completion of fetal anatomy. -Fetal growth assessments every 4 weeks. -Weekly BPP from [redacted] weeks gestation till delivery. Thank you for consultation.  If you have any questions or concerns, please contact me the [facility].  Consultation including face-to-face (more than 50%) counseling 30 minutes.

## 2022-01-25 ENCOUNTER — Encounter: Payer: Self-pay | Admitting: Family Medicine

## 2022-01-26 DIAGNOSIS — Z419 Encounter for procedure for purposes other than remedying health state, unspecified: Secondary | ICD-10-CM | POA: Diagnosis not present

## 2022-02-14 ENCOUNTER — Encounter: Payer: Medicaid Other | Admitting: Family Medicine

## 2022-02-14 DIAGNOSIS — O10919 Unspecified pre-existing hypertension complicating pregnancy, unspecified trimester: Secondary | ICD-10-CM

## 2022-02-14 DIAGNOSIS — O09529 Supervision of elderly multigravida, unspecified trimester: Secondary | ICD-10-CM

## 2022-02-14 DIAGNOSIS — Z3A22 22 weeks gestation of pregnancy: Secondary | ICD-10-CM

## 2022-02-14 DIAGNOSIS — Z98891 History of uterine scar from previous surgery: Secondary | ICD-10-CM

## 2022-02-14 DIAGNOSIS — O0990 Supervision of high risk pregnancy, unspecified, unspecified trimester: Secondary | ICD-10-CM

## 2022-02-21 ENCOUNTER — Ambulatory Visit: Payer: Medicaid Other | Attending: Obstetrics and Gynecology

## 2022-02-21 ENCOUNTER — Ambulatory Visit: Payer: Medicaid Other | Admitting: *Deleted

## 2022-02-21 ENCOUNTER — Encounter: Payer: Self-pay | Admitting: *Deleted

## 2022-02-21 ENCOUNTER — Other Ambulatory Visit: Payer: Self-pay | Admitting: *Deleted

## 2022-02-21 VITALS — BP 129/86 | HR 100

## 2022-02-21 DIAGNOSIS — E669 Obesity, unspecified: Secondary | ICD-10-CM

## 2022-02-21 DIAGNOSIS — O10919 Unspecified pre-existing hypertension complicating pregnancy, unspecified trimester: Secondary | ICD-10-CM

## 2022-02-21 DIAGNOSIS — O43192 Other malformation of placenta, second trimester: Secondary | ICD-10-CM

## 2022-02-21 DIAGNOSIS — O34219 Maternal care for unspecified type scar from previous cesarean delivery: Secondary | ICD-10-CM | POA: Diagnosis not present

## 2022-02-21 DIAGNOSIS — O10012 Pre-existing essential hypertension complicating pregnancy, second trimester: Secondary | ICD-10-CM | POA: Diagnosis not present

## 2022-02-21 DIAGNOSIS — O99212 Obesity complicating pregnancy, second trimester: Secondary | ICD-10-CM

## 2022-02-21 DIAGNOSIS — Z362 Encounter for other antenatal screening follow-up: Secondary | ICD-10-CM

## 2022-02-21 DIAGNOSIS — Z3A23 23 weeks gestation of pregnancy: Secondary | ICD-10-CM

## 2022-02-21 DIAGNOSIS — O09522 Supervision of elderly multigravida, second trimester: Secondary | ICD-10-CM | POA: Insufficient documentation

## 2022-02-21 DIAGNOSIS — O09523 Supervision of elderly multigravida, third trimester: Secondary | ICD-10-CM

## 2022-02-21 DIAGNOSIS — O43199 Other malformation of placenta, unspecified trimester: Secondary | ICD-10-CM

## 2022-02-25 DIAGNOSIS — Z419 Encounter for procedure for purposes other than remedying health state, unspecified: Secondary | ICD-10-CM | POA: Diagnosis not present

## 2022-03-28 ENCOUNTER — Other Ambulatory Visit: Payer: Self-pay

## 2022-03-28 ENCOUNTER — Ambulatory Visit: Payer: Medicaid Other | Attending: Family Medicine

## 2022-03-28 ENCOUNTER — Ambulatory Visit: Payer: Medicaid Other | Admitting: *Deleted

## 2022-03-28 VITALS — BP 136/81 | HR 100

## 2022-03-28 DIAGNOSIS — O34219 Maternal care for unspecified type scar from previous cesarean delivery: Secondary | ICD-10-CM

## 2022-03-28 DIAGNOSIS — Z362 Encounter for other antenatal screening follow-up: Secondary | ICD-10-CM | POA: Insufficient documentation

## 2022-03-28 DIAGNOSIS — Z3A28 28 weeks gestation of pregnancy: Secondary | ICD-10-CM

## 2022-03-28 DIAGNOSIS — O10919 Unspecified pre-existing hypertension complicating pregnancy, unspecified trimester: Secondary | ICD-10-CM | POA: Diagnosis not present

## 2022-03-28 DIAGNOSIS — E669 Obesity, unspecified: Secondary | ICD-10-CM | POA: Diagnosis not present

## 2022-03-28 DIAGNOSIS — O10013 Pre-existing essential hypertension complicating pregnancy, third trimester: Secondary | ICD-10-CM

## 2022-03-28 DIAGNOSIS — O09523 Supervision of elderly multigravida, third trimester: Secondary | ICD-10-CM | POA: Diagnosis not present

## 2022-03-28 DIAGNOSIS — O43193 Other malformation of placenta, third trimester: Secondary | ICD-10-CM | POA: Diagnosis not present

## 2022-03-28 DIAGNOSIS — O99213 Obesity complicating pregnancy, third trimester: Secondary | ICD-10-CM

## 2022-03-28 DIAGNOSIS — Z419 Encounter for procedure for purposes other than remedying health state, unspecified: Secondary | ICD-10-CM | POA: Diagnosis not present

## 2022-03-28 DIAGNOSIS — O43199 Other malformation of placenta, unspecified trimester: Secondary | ICD-10-CM | POA: Insufficient documentation

## 2022-03-28 DIAGNOSIS — O0990 Supervision of high risk pregnancy, unspecified, unspecified trimester: Secondary | ICD-10-CM

## 2022-03-29 ENCOUNTER — Other Ambulatory Visit: Payer: Medicaid Other

## 2022-03-29 ENCOUNTER — Other Ambulatory Visit: Payer: Self-pay | Admitting: *Deleted

## 2022-03-29 DIAGNOSIS — O0990 Supervision of high risk pregnancy, unspecified, unspecified trimester: Secondary | ICD-10-CM | POA: Diagnosis not present

## 2022-03-29 DIAGNOSIS — O10913 Unspecified pre-existing hypertension complicating pregnancy, third trimester: Secondary | ICD-10-CM

## 2022-03-30 LAB — CBC
Hematocrit: 35.6 % (ref 34.0–46.6)
Hemoglobin: 11.9 g/dL (ref 11.1–15.9)
MCH: 30.7 pg (ref 26.6–33.0)
MCHC: 33.4 g/dL (ref 31.5–35.7)
MCV: 92 fL (ref 79–97)
Platelets: 289 10*3/uL (ref 150–450)
RBC: 3.87 x10E6/uL (ref 3.77–5.28)
RDW: 13.3 % (ref 11.7–15.4)
WBC: 9 10*3/uL (ref 3.4–10.8)

## 2022-03-30 LAB — RPR: RPR Ser Ql: NONREACTIVE

## 2022-03-30 LAB — HIV ANTIBODY (ROUTINE TESTING W REFLEX): HIV Screen 4th Generation wRfx: NONREACTIVE

## 2022-03-30 LAB — GLUCOSE TOLERANCE, 2 HOURS W/ 1HR
Glucose, 1 hour: 157 mg/dL (ref 70–179)
Glucose, 2 hour: 109 mg/dL (ref 70–152)
Glucose, Fasting: 80 mg/dL (ref 70–91)

## 2022-04-04 ENCOUNTER — Other Ambulatory Visit: Payer: Self-pay | Admitting: *Deleted

## 2022-04-04 DIAGNOSIS — O10913 Unspecified pre-existing hypertension complicating pregnancy, third trimester: Secondary | ICD-10-CM

## 2022-04-05 ENCOUNTER — Encounter: Payer: Self-pay | Admitting: Family Medicine

## 2022-04-14 ENCOUNTER — Ambulatory Visit: Payer: Self-pay | Admitting: *Deleted

## 2022-04-14 NOTE — Telephone Encounter (Signed)
Message from Elliot Gault sent at 04/14/2022  8:46 AM EDT  Summary: cough, stuffy nose    Patient experiencing a stuffy nose and cough, patient is [redacted] weeks pregnant. Patient unable to reach her OB office.           Call History   Type Contact Phone/Fax User  04/14/2022 08:45 AM EDT Phone (Incoming) Rutherford Limerick (Self) [phone] Harold Barban   Reason for Disposition  Care advice for mild cough, questions about    [redacted] weeks pregnant.   Instructed to contact her pharmacist for which medications are safe with pregnancy since she can not get in touch with her OB dr.  Answer Assessment - Initial Assessment Questions 1. ONSET: "When did the nasal discharge start?"      I'm [redacted] weeks pregnant.   I can't get in contact with my OB doctor.   Is it ok to take Tylenol medicine?   Congestion and coughing.    Stuffy nose or running.  I'm having pregnancy carpel tunnel.   I can't close my hands due to this.   Is there anything I can do to relieve the pain?    I have wrist brace on.   They are just swollen.  Try ice and keeping them elevated.   When I do that my hands go numb.   They tingle.   I recommended she get in touch with her OB dr pertaining to her hands swelling.    Also to contact her pharmacist regarding what is safe to take with pregnancy for congestion and coughing.   She was agreeable to this plan.  As far as contacting her OB dr I let her know there should be a dr. On call.   The message she gets when she calls the office should tell her a way to get in touch with the dr on call.   She was agreeable to this plan and thanked me for my help.   2. AMOUNT: "How much discharge is there?"      My nose is runny sometimes and then stopped up at times. 3. COUGH: "Do you have a cough?" If Yes, ask: "Describe the color of your sputum" (clear, white, yellow, green)     A little clear mucus   My mucus is clear that I'm coughing up and blowing from my nose. 4. RESPIRATORY DISTRESS: "Describe  your breathing."      No respiratory issues or other symptoms when asked just the nasal congestion and cough. 5. FEVER: "Do you have a fever?" If Yes, ask: "What is your temperature, how was it measured, and when did it start?"     No 6. SEVERITY: "Overall, how bad are you feeling right now?" (e.g., doesn't interfere with normal activities, staying home from school/work, staying in bed)      Congested 7. OTHER SYMPTOMS: "Do you have any other symptoms?" (e.g., sore throat, earache, wheezing, vomiting)     No 8. PREGNANCY: "Is there any chance you are pregnant?" "When was your last menstrual period?"     Yes [redacted] weeks pregnant.   I haven't been able to get in touch with my OB dr this morning.   They were closed earlier and I have not tried again.   I've been laying down.  Protocols used: Common Cold-A-AH

## 2022-04-14 NOTE — Telephone Encounter (Signed)
  Chief Complaint: nasal congestion and coughing   What is safe to take with pregnancy?    [redacted] weeks pregnant.  Can I take Tylenol medicine for a cold Symptoms: runny nose and stopped up nose alternating.   Coughing clear mucus Frequency: Now Pertinent Negatives: Patient denies Fever Disposition: [] ED /[] Urgent Care (no appt availability in office) / [] Appointment(In office/virtual)/ []  [facility]/ [x] Home Care/ [] Refused Recommended Disposition /[] [facility]/ []  Follow-up with PCP Additional Notes: Advised her to contact the OB dr. On call since her OB office is closed (or was earlier today) for advice on her swollen hands (pregnancy carpel tunnel) and to contact her pharmacist for advice on what is safe to take with pregnancy if she can't get in touch with her OB dr or the one on call.

## 2022-04-17 ENCOUNTER — Ambulatory Visit (INDEPENDENT_AMBULATORY_CARE_PROVIDER_SITE_OTHER): Payer: Medicaid Other | Admitting: Obstetrics and Gynecology

## 2022-04-17 ENCOUNTER — Other Ambulatory Visit: Payer: Self-pay

## 2022-04-17 VITALS — BP 140/76 | HR 118 | Wt 217.9 lb

## 2022-04-17 DIAGNOSIS — Z98891 History of uterine scar from previous surgery: Secondary | ICD-10-CM

## 2022-04-17 DIAGNOSIS — G5600 Carpal tunnel syndrome, unspecified upper limb: Secondary | ICD-10-CM

## 2022-04-17 DIAGNOSIS — Z3A31 31 weeks gestation of pregnancy: Secondary | ICD-10-CM

## 2022-04-17 DIAGNOSIS — O10919 Unspecified pre-existing hypertension complicating pregnancy, unspecified trimester: Secondary | ICD-10-CM

## 2022-04-17 DIAGNOSIS — O09523 Supervision of elderly multigravida, third trimester: Secondary | ICD-10-CM

## 2022-04-17 DIAGNOSIS — G4733 Obstructive sleep apnea (adult) (pediatric): Secondary | ICD-10-CM

## 2022-04-17 DIAGNOSIS — O99353 Diseases of the nervous system complicating pregnancy, third trimester: Secondary | ICD-10-CM

## 2022-04-17 DIAGNOSIS — O0990 Supervision of high risk pregnancy, unspecified, unspecified trimester: Secondary | ICD-10-CM

## 2022-04-17 NOTE — Progress Notes (Signed)
   PRENATAL VISIT NOTE  Subjective:  Heather Clements is a 41 y.o. G4P1021 at [redacted]w[redacted]d being seen today for ongoing prenatal care.  She is currently monitored for the following issues for this high-risk pregnancy and has OSA (obstructive sleep apnea); Migraine with aura and without status migrainosus, not intractable; Supervision of high risk pregnancy, antepartum; AMA (advanced maternal age) multigravida 35+; Chronic hypertension affecting pregnancy; and History of cesarean delivery on their problem list.  Patient doing well with no acute concerns today. She reports no complaints.  Contractions: Not present. Vag. Bleeding: None.  Movement: Present. Denies leaking of fluid.   The following portions of the patient's history were reviewed and updated as appropriate: allergies, current medications, past family history, past medical history, past social history, past surgical history and problem list. Problem list updated.  Objective:   Vitals:   04/17/22 1618  BP: (!) 140/76  Pulse: (!) 118  Weight: 217 lb 14.4 oz (98.8 kg)    Fetal Status: Fetal Heart Rate (bpm): 145 Fundal Height: 30 cm Movement: Present     General:  Alert, oriented and cooperative. Patient is in no acute distress.  Skin: Skin is warm and dry. No rash noted.   Cardiovascular: Normal heart rate noted  Respiratory: Normal respiratory effort, no problems with respiration noted  Abdomen: Soft, gravid, appropriate for gestational age.  Pain/Pressure: Present     Pelvic: Cervical exam deferred        Extremities: Normal range of motion.  Edema: Trace  Mental Status:  Normal mood and affect. Normal behavior. Normal judgment and thought content.   Assessment and Plan:  Pregnancy: G4P1021 at [redacted]w[redacted]d  1. [redacted] weeks gestation of pregnancy   2. Chronic hypertension affecting pregnancy Pt compliant with procardia, will start weekly fetal testing in 1 week  3. OSA (obstructive sleep apnea) Pt needs reevaluation for possible CPAP after  delivery  4. Supervision of high risk pregnancy, antepartum Continue routine prenatal care  5. Multigravida of advanced maternal age in third trimester   6. History of cesarean delivery Pt desires repeat cesarean section with BTL Repeat c section consent signed  7. 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  Please refer to After Visit Summary for other counseling recommendations.   Return in about 2 weeks (around 05/01/2022) for [facility], in person.   Mariel Aloe, MD Faculty Attending Center for [facility]

## 2022-04-24 ENCOUNTER — Ambulatory Visit: Payer: Medicaid Other

## 2022-04-27 ENCOUNTER — Ambulatory Visit: Payer: Medicaid Other | Attending: Obstetrics and Gynecology

## 2022-04-27 ENCOUNTER — Ambulatory Visit: Payer: Medicaid Other | Admitting: *Deleted

## 2022-04-27 ENCOUNTER — Encounter: Payer: Self-pay | Admitting: *Deleted

## 2022-04-27 VITALS — BP 144/80 | HR 106

## 2022-04-27 DIAGNOSIS — O10013 Pre-existing essential hypertension complicating pregnancy, third trimester: Secondary | ICD-10-CM

## 2022-04-27 DIAGNOSIS — O43193 Other malformation of placenta, third trimester: Secondary | ICD-10-CM

## 2022-04-27 DIAGNOSIS — O99213 Obesity complicating pregnancy, third trimester: Secondary | ICD-10-CM

## 2022-04-27 DIAGNOSIS — O10919 Unspecified pre-existing hypertension complicating pregnancy, unspecified trimester: Secondary | ICD-10-CM | POA: Insufficient documentation

## 2022-04-27 DIAGNOSIS — Z3A32 32 weeks gestation of pregnancy: Secondary | ICD-10-CM

## 2022-04-27 DIAGNOSIS — O10913 Unspecified pre-existing hypertension complicating pregnancy, third trimester: Secondary | ICD-10-CM | POA: Diagnosis not present

## 2022-04-27 DIAGNOSIS — O34219 Maternal care for unspecified type scar from previous cesarean delivery: Secondary | ICD-10-CM | POA: Diagnosis not present

## 2022-04-27 DIAGNOSIS — E669 Obesity, unspecified: Secondary | ICD-10-CM | POA: Diagnosis not present

## 2022-04-27 DIAGNOSIS — O09523 Supervision of elderly multigravida, third trimester: Secondary | ICD-10-CM

## 2022-04-28 ENCOUNTER — Other Ambulatory Visit: Payer: Self-pay | Admitting: *Deleted

## 2022-04-28 DIAGNOSIS — O99213 Obesity complicating pregnancy, third trimester: Secondary | ICD-10-CM

## 2022-04-28 DIAGNOSIS — Z419 Encounter for procedure for purposes other than remedying health state, unspecified: Secondary | ICD-10-CM | POA: Diagnosis not present

## 2022-04-28 DIAGNOSIS — O09523 Supervision of elderly multigravida, third trimester: Secondary | ICD-10-CM

## 2022-04-28 DIAGNOSIS — O10913 Unspecified pre-existing hypertension complicating pregnancy, third trimester: Secondary | ICD-10-CM

## 2022-04-28 DIAGNOSIS — O43199 Other malformation of placenta, unspecified trimester: Secondary | ICD-10-CM

## 2022-05-02 ENCOUNTER — Other Ambulatory Visit: Payer: Self-pay | Admitting: *Deleted

## 2022-05-02 ENCOUNTER — Encounter: Payer: Self-pay | Admitting: *Deleted

## 2022-05-02 ENCOUNTER — Ambulatory Visit: Payer: Medicaid Other | Admitting: *Deleted

## 2022-05-02 ENCOUNTER — Ambulatory Visit: Payer: Medicaid Other | Attending: Obstetrics and Gynecology

## 2022-05-02 VITALS — BP 142/83 | HR 118

## 2022-05-02 DIAGNOSIS — O10913 Unspecified pre-existing hypertension complicating pregnancy, third trimester: Secondary | ICD-10-CM | POA: Diagnosis not present

## 2022-05-02 DIAGNOSIS — O10013 Pre-existing essential hypertension complicating pregnancy, third trimester: Secondary | ICD-10-CM | POA: Diagnosis not present

## 2022-05-02 DIAGNOSIS — O10919 Unspecified pre-existing hypertension complicating pregnancy, unspecified trimester: Secondary | ICD-10-CM | POA: Diagnosis not present

## 2022-05-02 DIAGNOSIS — O34219 Maternal care for unspecified type scar from previous cesarean delivery: Secondary | ICD-10-CM | POA: Diagnosis not present

## 2022-05-02 DIAGNOSIS — Z3A33 33 weeks gestation of pregnancy: Secondary | ICD-10-CM

## 2022-05-02 DIAGNOSIS — O43193 Other malformation of placenta, third trimester: Secondary | ICD-10-CM | POA: Diagnosis not present

## 2022-05-02 DIAGNOSIS — O99213 Obesity complicating pregnancy, third trimester: Secondary | ICD-10-CM | POA: Diagnosis not present

## 2022-05-02 DIAGNOSIS — O09523 Supervision of elderly multigravida, third trimester: Secondary | ICD-10-CM

## 2022-05-02 DIAGNOSIS — E669 Obesity, unspecified: Secondary | ICD-10-CM

## 2022-05-09 ENCOUNTER — Ambulatory Visit: Payer: Medicaid Other | Attending: Obstetrics and Gynecology

## 2022-05-09 ENCOUNTER — Ambulatory Visit: Payer: Medicaid Other | Admitting: *Deleted

## 2022-05-09 ENCOUNTER — Encounter: Payer: Self-pay | Admitting: *Deleted

## 2022-05-09 VITALS — BP 132/86 | HR 114

## 2022-05-09 DIAGNOSIS — O09523 Supervision of elderly multigravida, third trimester: Secondary | ICD-10-CM | POA: Diagnosis not present

## 2022-05-09 DIAGNOSIS — E669 Obesity, unspecified: Secondary | ICD-10-CM

## 2022-05-09 DIAGNOSIS — O10013 Pre-existing essential hypertension complicating pregnancy, third trimester: Secondary | ICD-10-CM

## 2022-05-09 DIAGNOSIS — Z3A34 34 weeks gestation of pregnancy: Secondary | ICD-10-CM

## 2022-05-09 DIAGNOSIS — O34219 Maternal care for unspecified type scar from previous cesarean delivery: Secondary | ICD-10-CM | POA: Diagnosis not present

## 2022-05-09 DIAGNOSIS — O43193 Other malformation of placenta, third trimester: Secondary | ICD-10-CM

## 2022-05-09 DIAGNOSIS — O10919 Unspecified pre-existing hypertension complicating pregnancy, unspecified trimester: Secondary | ICD-10-CM

## 2022-05-09 DIAGNOSIS — O10913 Unspecified pre-existing hypertension complicating pregnancy, third trimester: Secondary | ICD-10-CM | POA: Diagnosis not present

## 2022-05-09 DIAGNOSIS — O99213 Obesity complicating pregnancy, third trimester: Secondary | ICD-10-CM | POA: Diagnosis not present

## 2022-05-11 ENCOUNTER — Encounter: Payer: Medicaid Other | Admitting: Obstetrics and Gynecology

## 2022-05-11 ENCOUNTER — Encounter: Payer: Self-pay | Admitting: Obstetrics and Gynecology

## 2022-05-11 NOTE — Progress Notes (Signed)
Patient did not keep her OB appointment for 05/11/2022.  Sevyn Markham, Jr MD Attending [facility] (Faculty Practice)  

## 2022-05-18 ENCOUNTER — Ambulatory Visit: Payer: Medicaid Other | Attending: Obstetrics and Gynecology

## 2022-05-18 ENCOUNTER — Ambulatory Visit: Payer: Medicaid Other | Admitting: *Deleted

## 2022-05-18 VITALS — BP 121/78 | HR 115

## 2022-05-18 DIAGNOSIS — E669 Obesity, unspecified: Secondary | ICD-10-CM | POA: Diagnosis not present

## 2022-05-18 DIAGNOSIS — O10913 Unspecified pre-existing hypertension complicating pregnancy, third trimester: Secondary | ICD-10-CM | POA: Diagnosis not present

## 2022-05-18 DIAGNOSIS — O10013 Pre-existing essential hypertension complicating pregnancy, third trimester: Secondary | ICD-10-CM

## 2022-05-18 DIAGNOSIS — O99213 Obesity complicating pregnancy, third trimester: Secondary | ICD-10-CM | POA: Diagnosis not present

## 2022-05-18 DIAGNOSIS — O43199 Other malformation of placenta, unspecified trimester: Secondary | ICD-10-CM | POA: Diagnosis not present

## 2022-05-18 DIAGNOSIS — O34219 Maternal care for unspecified type scar from previous cesarean delivery: Secondary | ICD-10-CM

## 2022-05-18 DIAGNOSIS — O10919 Unspecified pre-existing hypertension complicating pregnancy, unspecified trimester: Secondary | ICD-10-CM | POA: Diagnosis not present

## 2022-05-18 DIAGNOSIS — O09523 Supervision of elderly multigravida, third trimester: Secondary | ICD-10-CM | POA: Insufficient documentation

## 2022-05-18 DIAGNOSIS — Z3A35 35 weeks gestation of pregnancy: Secondary | ICD-10-CM | POA: Diagnosis not present

## 2022-05-18 DIAGNOSIS — O43193 Other malformation of placenta, third trimester: Secondary | ICD-10-CM | POA: Diagnosis not present

## 2022-05-25 ENCOUNTER — Ambulatory Visit: Payer: Medicaid Other | Admitting: *Deleted

## 2022-05-25 ENCOUNTER — Ambulatory Visit: Payer: Medicaid Other | Attending: Obstetrics and Gynecology

## 2022-05-25 ENCOUNTER — Ambulatory Visit (INDEPENDENT_AMBULATORY_CARE_PROVIDER_SITE_OTHER): Payer: Medicaid Other | Admitting: Obstetrics and Gynecology

## 2022-05-25 ENCOUNTER — Other Ambulatory Visit: Payer: Self-pay

## 2022-05-25 ENCOUNTER — Other Ambulatory Visit (HOSPITAL_COMMUNITY)
Admission: RE | Admit: 2022-05-25 | Discharge: 2022-05-25 | Disposition: A | Discharge status: Home / Self Care | Payer: Medicaid Other | Source: Ambulatory Visit | Attending: Obstetrics and Gynecology | Admitting: Obstetrics and Gynecology

## 2022-05-25 ENCOUNTER — Ambulatory Visit: Payer: Medicaid Other

## 2022-05-25 ENCOUNTER — Encounter: Payer: Self-pay | Admitting: Obstetrics and Gynecology

## 2022-05-25 VITALS — BP 122/74 | HR 97

## 2022-05-25 VITALS — BP 142/83 | HR 112 | Wt 222.5 lb

## 2022-05-25 DIAGNOSIS — O99213 Obesity complicating pregnancy, third trimester: Secondary | ICD-10-CM | POA: Diagnosis not present

## 2022-05-25 DIAGNOSIS — O10913 Unspecified pre-existing hypertension complicating pregnancy, third trimester: Secondary | ICD-10-CM | POA: Diagnosis not present

## 2022-05-25 DIAGNOSIS — O0990 Supervision of high risk pregnancy, unspecified, unspecified trimester: Secondary | ICD-10-CM

## 2022-05-25 DIAGNOSIS — O43193 Other malformation of placenta, third trimester: Secondary | ICD-10-CM | POA: Diagnosis not present

## 2022-05-25 DIAGNOSIS — O10919 Unspecified pre-existing hypertension complicating pregnancy, unspecified trimester: Secondary | ICD-10-CM

## 2022-05-25 DIAGNOSIS — O09523 Supervision of elderly multigravida, third trimester: Secondary | ICD-10-CM | POA: Insufficient documentation

## 2022-05-25 DIAGNOSIS — O43199 Other malformation of placenta, unspecified trimester: Secondary | ICD-10-CM | POA: Insufficient documentation

## 2022-05-25 DIAGNOSIS — Z3A36 36 weeks gestation of pregnancy: Secondary | ICD-10-CM

## 2022-05-25 DIAGNOSIS — E669 Obesity, unspecified: Secondary | ICD-10-CM

## 2022-05-25 DIAGNOSIS — Z3009 Encounter for other general counseling and advice on contraception: Secondary | ICD-10-CM

## 2022-05-25 DIAGNOSIS — Z98891 History of uterine scar from previous surgery: Secondary | ICD-10-CM

## 2022-05-25 DIAGNOSIS — O10013 Pre-existing essential hypertension complicating pregnancy, third trimester: Secondary | ICD-10-CM | POA: Diagnosis not present

## 2022-05-25 NOTE — Patient Instructions (Signed)
Cesarean Delivery, Care After The following information offers guidance on how to care for yourself after your procedure. Your health care provider may also give you more specific instructions. If you have problems or questions, contact your health care provider. What can I expect after the procedure? After the procedure, it is common to have: A small amount of blood or clear fluid coming from the incision. Some redness, swelling, and pain in your incision area. Some abdominal pain and soreness. Vaginal bleeding (lochia). Even though you did not have a vaginal delivery, you will still have vaginal bleeding and discharge. Pelvic cramps. Fatigue. You may have pain, swelling, and discomfort in the tissue between your vagina and your anus (perineum) if: Your C-section was unplanned, and you were allowed to labor and push. An incision was made in the area (episiotomy) or the tissue tore during attempted vaginal delivery. Follow these instructions at home: Medicines Take over-the-counter and prescription medicines only as told by your health care provider. If you were prescribed an antibiotic medicine, take it as told by your health care provider. Do not stop taking the antibiotic even if you start to feel better. Ask your health care provider if the medicine prescribed to you requires you to avoid driving or using machinery. Incision care  Follow instructions from your health care provider about how to take care of your incision. Make sure you: Wash your hands with soap and water for an least 20 seconds before and after you change your bandage (dressing). If soap and water are not available, use hand sanitizer. If you have a dressing, change it or remove it as told by your health care provider. Leave stitches (sutures), skin staples, skin glue, or adhesive strips in place. These skin closures may need to stay in place for 2 weeks or longer. If adhesive strip edges start to loosen and curl up, you  may trim the loose edges. Do not remove adhesive strips completely unless your health care provider tells you to do that. Check your incision area every day for signs of infection. Check for: More redness, swelling, or pain. More fluid or blood. Warmth. Pus or a bad smell. Do not take baths, swim, or use a hot tub until your health care provider approves. Ask your health care provider if you may take showers. When you cough or sneeze, hug a pillow. This helps with pain and decreases the chance of your incision opening up (dehiscing). Do this until your incision heals. Managing constipation Your procedure may cause constipation. To prevent or treat constipation, you may need to: Drink enough fluid to keep your urine pale yellow. Take over-the-counter or prescription medicines. Eat foods that are high in fiber, such as beans, whole grains, and fresh fruits and vegetables. Limit foods that are high in fat and processed sugars, such as fried or sweet foods. Activity  If possible, have someone help you care for your baby and help with household activities for at least a few days after you leave the hospital. Rest as much as possible. Try to rest or take a nap while your baby is sleeping. You may have to avoid lifting. Ask your health care provider how much you can safely lift. Return to your normal activities as told by your health care provider. Ask your health care provider what activities are safe for you. Talk with your health care provider about when you can engage in sexual activity. This may depend on your: Risk of infection. How fast you heal. Comfort   and desire to engage in sexual activity. Lifestyle Do not drink alcohol. This is especially important if you are breastfeeding or taking pain medicine. Do not use any products that contain nicotine or tobacco. These products include cigarettes, chewing tobacco, and vaping devices, such as e-cigarettes. If you need help quitting, ask your  health care provider. General instructions Do not use tampons or douches until your health care provider approves. Wear loose, comfortable clothing and a supportive and well-fitting bra. If you pass a blood clot, save it and call your health care provider to discuss. Do not flush blood clots down the toilet before you get instructions from your health care provider. Keep all follow-up visits for you and your baby. This is important. Contact a health care provider if: You have: A fever. Dizziness or light-headedness. Bad-smelling vaginal discharge. A blood clot pass from your vagina. Pus, blood, or a bad smell coming from your incision. An incision that feels warm to the touch. More redness, swelling, or pain around your incision. Difficulty or pain when urinating. Nausea or vomiting. Little or no interest in activities you used to enjoy. Your breasts turn red or become painful or hard. You feel unusually sad or worried. You have questions about caring for yourself or your baby. You have redness, swelling, and pain in an arm or leg. Get help right away if: You have: Pain that does not go away or get better with medicine. Chest pain. Trouble breathing. Blurred vision, spots, or flashing lights in your vision. Thoughts about hurting yourself or your baby. New pain in your abdomen or in one of your legs. A severe headache that does not get better with pain medicine. You faint. You bleed from your vagina so much that you fill more than one sanitary pad in one hour. Bleeding should not be heavier than your heaviest period. These symptoms may be an emergency. Get help right away. Call 911. Do not wait to see if the symptoms will go away. Do not drive yourself to the hospital. Get help right away if you feel like you may hurt yourself or others, or have thoughts about taking your own life. Go to your nearest emergency room or: Call 911. Call the [facility] at  [phone] or 988. This is open 24 hours a day. Text the Crisis Text Line at 810-574-0757. Summary After the procedure, it is common to have pain at your incision site, abdominal cramping, and slight bleeding from your vagina. Check your incision area every day for signs of infection. Tell your health care provider about any unusual symptoms. Keep all follow-up visits for you and your baby. This is important. This information is not intended to replace advice given to you by your health care provider. Make sure you discuss any questions you have with your health care provider. Document Revised: 03/16/2021 Document Reviewed: 03/16/2021 Elsevier Patient Education  [place]. Cesarean Delivery Cesarean birth, or cesarean delivery, is the surgical delivery of a baby through an incision in the abdomen and the uterus. This may be referred to as a C-section. This procedure may be scheduled ahead of time, or it may be done in an emergency situation. Tell a health care provider about: Any allergies you have. All medicines you are taking, including vitamins, herbs, eye drops, creams, and over-the-counter medicines. Any problems you or family members have had with anesthetic medicines. Any bleeding problems you have. Any surgeries you have had. Any medical conditions you have. Whether you or  any members of your family have a history of deep vein thrombosis (DVT) or pulmonary embolism (PE). What are the risks? Generally, this is a safe procedure. However, problems may occur, including: Infection. Bleeding. Allergic reactions to medicines. Damage to other structures or organs. Blood clots. Injury to your baby. What happens before the procedure? Medicines Ask your health care provider about: Changing or stopping your regular medicines. This is especially important if you are taking diabetes medicines or blood thinners. Taking medicines such as aspirin and ibuprofen. These medicines can thin  your blood. Do not take these medicines unless your health care provider tells you to take them. Taking over-the-counter medicines, vitamins, herbs, and supplements. Tests Depending on the reason for your cesarean delivery, you may have a physical exam or additional testing, such as an ultrasound. You may have your blood or urine tested. General instructions Follow instructions from your health care provider about eating or drinking restrictions. Do not shave your pubic area if you know that you are going to have a cesarean delivery. Shaving before the procedure may increase your risk of infection. Plan to have someone take you home from the hospital. Ask your health care provider what steps will be taken to prevent infection. These may include: Washing skin with a germ-killing soap. Taking antibiotic medicine. Questions for your health care provider Ask your health care provider about: Your pain management plan. This is especially important if you plan to breastfeed your baby. How long you will be in the hospital after the procedure. Any concerns you may have about receiving blood products, if you need them during the procedure. Cord blood banking, if you plan to collect your baby's umbilical cord blood. You may also want to ask your health care provider: Whether you will be able to hold or breastfeed your baby while you are still in the operating room. Whether your baby can stay with you immediately after the procedure and during your recovery. Whether a family member or a person of your choice can go with you into the operating room and stay with you during the procedure, immediately after the procedure, and during your recovery. What happens during the procedure?  An IV will be inserted into one of your veins. Fluid and medicines, such as antibiotics, will be given before the surgery. Fetal monitors will be placed on your abdomen to check your baby's heart rate. You may be given a  warming gown to wear to keep your temperature stable. A catheter may be inserted into your bladder through your urethra. This drains your urine during the procedure. You may be given one or more of the following: A medicine to numb the area (local anesthetic). A medicine to make you fall asleep (general anesthetic). A medicine (regional anesthetic) that is injected into your back or through a small thin tube placed in your back (spinal anesthetic or epidural anesthetic). This numbs everything below the injection site and allows you to stay awake during your procedure. If this makes you feel nauseous, tell your health care provider. Medicines will be available to help reduce any nausea you may feel. An incision will be made in your abdomen, and then in your uterus. If you are awake during your procedure, you may feel tugging and pulling in your abdomen, but you should not feel pain. If you feel pain, tell your health care provider immediately. Your baby will be removed from your uterus. You may feel more pressure or pushing while this happens. Immediately after birth,  your baby will be dried and kept warm. You may be able to hold and breastfeed your baby. The umbilical cord may be clamped and cut during this time. This usually occurs after waiting a period of 1-2 minutes after delivery. Your placenta will be removed from your uterus. Your incisions will be closed with stitches (sutures). Staples, skin glue, or adhesive strips may also be applied to the incision in your abdomen. Bandages (dressings) may be placed over the incision in your abdomen. The procedure may vary among health care providers and hospitals. What happens after the procedure? Your blood pressure, heart rate, breathing rate, and blood oxygen level will be monitored until you leave the hospital or clinic. You may continue to receive fluids and medicines through an IV. You will have some pain. Medicines will be available to help  control your pain. To help prevent blood clots: You may be given medicines. You may have to wear compression stockings or devices. You will be encouraged to walk around when you are able. Hospital staff will encourage and support bonding with your baby. Your hospital may have you and your baby stay in the same room (rooming in) during your hospital stay to encourage successful bonding and breastfeeding. You may be encouraged to cough and breathe deeply often. This helps to prevent lung problems. If you have a catheter draining your urine, it will be removed as soon as possible after your procedure. Summary Cesarean birth, or cesarean delivery, is the surgical delivery of a baby through an incision in the abdomen and the uterus. Follow instructions from your health care provider about eating or drinking restrictions before the procedure. You will have some pain after the procedure. Medicines will be available to help control your pain. Hospital staff will encourage and support bonding with your baby after the procedure. Your hospital may have you and your baby stay in the same room (rooming in) during your hospital stay to encourage successful bonding and breastfeeding. This information is not intended to replace advice given to you by your health care provider. Make sure you discuss any questions you have with your health care provider. Document Revised: 03/16/2021 Document Reviewed: 03/16/2021 Elsevier Patient Education  [place].

## 2022-05-25 NOTE — Progress Notes (Signed)
Subjective:  Heather Clements is a 41 y.o. G4P1021 at [redacted]w[redacted]d being seen today for ongoing prenatal care.  She is currently monitored for the following issues for this high-risk pregnancy and has OSA (obstructive sleep apnea); Migraine with aura and without status migrainosus, not intractable; Supervision of high risk pregnancy, antepartum; AMA (advanced maternal age) multigravida 35+; Chronic hypertension affecting pregnancy; History of cesarean delivery; and Unwanted fertility on their problem list.  Patient reports general discomforts.  Contractions: Irritability. Vag. Bleeding: None.  Movement: Present. Denies leaking of fluid.   The following portions of the patient's history were reviewed and updated as appropriate: allergies, current medications, past family history, past medical history, past social history, past surgical history and problem list. Problem list updated.  Objective:   Vitals:   05/25/22 1056  BP: (!) 142/83  Pulse: (!) 112  Weight: 222 lb 8 oz (100.9 kg)    Fetal Status: Fetal Heart Rate (bpm): 158   Movement: Present     General:  Alert, oriented and cooperative. Patient is in no acute distress.  Skin: Skin is warm and dry. No rash noted.   Cardiovascular: Normal heart rate noted  Respiratory: Normal respiratory effort, no problems with respiration noted  Abdomen: Soft, gravid, appropriate for gestational age. Pain/Pressure: Present     Pelvic:  Cervical exam deferred        Extremities: Normal range of motion.  Edema: Trace  Mental Status: Normal mood and affect. Normal behavior. Normal judgment and thought content.   Urinalysis:      Assessment and Plan:  Pregnancy: G4P1021 at [redacted]w[redacted]d  1. Supervision of high risk pregnancy, antepartum Labor precaution - GC/Chlamydia probe amp ([place])not at [facility] - Culture, beta strep (group b only)  2. History of cesarean delivery For repeat on 06/12/22  3. Chronic hypertension affecting pregnancy BP stable Continue with  current management Serial growth scans and antenatal testing as per MFM  4. Multigravida of advanced maternal age in third trimester Stable  5. Unwanted fertility BTL papers signed  Term labor symptoms and general obstetric precautions including but not limited to vaginal bleeding, contractions, leaking of fluid and fetal movement were reviewed in detail with the patient. Please refer to After Visit Summary for other counseling recommendations.  Return in about 1 week (around 06/01/2022) for OB visit, face to face, any provider.   Chancy Milroy, MD

## 2022-05-26 LAB — GC/CHLAMYDIA PROBE AMP (~~LOC~~) NOT AT ARMC
Chlamydia: NEGATIVE
Comment: NEGATIVE
Comment: NORMAL
Neisseria Gonorrhea: NEGATIVE

## 2022-05-28 DIAGNOSIS — Z419 Encounter for procedure for purposes other than remedying health state, unspecified: Secondary | ICD-10-CM | POA: Diagnosis not present

## 2022-05-29 LAB — CULTURE, BETA STREP (GROUP B ONLY): Strep Gp B Culture: NEGATIVE

## 2022-05-30 ENCOUNTER — Ambulatory Visit: Payer: Medicaid Other | Attending: Obstetrics

## 2022-05-30 ENCOUNTER — Ambulatory Visit: Payer: Medicaid Other | Admitting: *Deleted

## 2022-05-30 VITALS — BP 126/76 | HR 108

## 2022-05-30 DIAGNOSIS — O09523 Supervision of elderly multigravida, third trimester: Secondary | ICD-10-CM | POA: Insufficient documentation

## 2022-05-30 DIAGNOSIS — O34219 Maternal care for unspecified type scar from previous cesarean delivery: Secondary | ICD-10-CM | POA: Diagnosis not present

## 2022-05-30 DIAGNOSIS — O10919 Unspecified pre-existing hypertension complicating pregnancy, unspecified trimester: Secondary | ICD-10-CM | POA: Insufficient documentation

## 2022-06-01 ENCOUNTER — Ambulatory Visit (INDEPENDENT_AMBULATORY_CARE_PROVIDER_SITE_OTHER): Payer: Medicaid Other | Admitting: Obstetrics and Gynecology

## 2022-06-01 ENCOUNTER — Encounter: Payer: Self-pay | Admitting: Obstetrics and Gynecology

## 2022-06-01 VITALS — BP 123/82 | HR 89 | Wt 219.5 lb

## 2022-06-01 DIAGNOSIS — O0990 Supervision of high risk pregnancy, unspecified, unspecified trimester: Secondary | ICD-10-CM | POA: Diagnosis not present

## 2022-06-01 DIAGNOSIS — Z3009 Encounter for other general counseling and advice on contraception: Secondary | ICD-10-CM

## 2022-06-01 DIAGNOSIS — O10919 Unspecified pre-existing hypertension complicating pregnancy, unspecified trimester: Secondary | ICD-10-CM

## 2022-06-01 DIAGNOSIS — Z98891 History of uterine scar from previous surgery: Secondary | ICD-10-CM | POA: Diagnosis not present

## 2022-06-01 DIAGNOSIS — Z3A37 37 weeks gestation of pregnancy: Secondary | ICD-10-CM

## 2022-06-01 DIAGNOSIS — O09523 Supervision of elderly multigravida, third trimester: Secondary | ICD-10-CM

## 2022-06-01 NOTE — Patient Instructions (Signed)
Cesarean Delivery Cesarean birth, or cesarean delivery, is the surgical delivery of a baby through an incision in the abdomen and the uterus. This may be referred to as a C-section. This procedure may be scheduled ahead of time, or it may be done in an emergency situation. Tell a health care provider about: Any allergies you have. All medicines you are taking, including vitamins, herbs, eye drops, creams, and over-the-counter medicines. Any problems you or family members have had with anesthetic medicines. Any bleeding problems you have. Any surgeries you have had. Any medical conditions you have. Whether you or any members of your family have a history of deep vein thrombosis (DVT) or pulmonary embolism (PE). What are the risks? Generally, this is a safe procedure. However, problems may occur, including: Infection. Bleeding. Allergic reactions to medicines. Damage to other structures or organs. Blood clots. Injury to your baby. What happens before the procedure? Medicines Ask your health care provider about: Changing or stopping your regular medicines. This is especially important if you are taking diabetes medicines or blood thinners. Taking medicines such as aspirin and ibuprofen. These medicines can thin your blood. Do not take these medicines unless your health care provider tells you to take them. Taking over-the-counter medicines, vitamins, herbs, and supplements. Tests Depending on the reason for your cesarean delivery, you may have a physical exam or additional testing, such as an ultrasound. You may have your blood or urine tested. General instructions Follow instructions from your health care provider about eating or drinking restrictions. Do not shave your pubic area if you know that you are going to have a cesarean delivery. Shaving before the procedure may increase your risk of infection. Plan to have someone take you home from the hospital. Ask your health care provider  what steps will be taken to prevent infection. These may include: Washing skin with a germ-killing soap. Taking antibiotic medicine. Questions for your health care provider Ask your health care provider about: Your pain management plan. This is especially important if you plan to breastfeed your baby. How long you will be in the hospital after the procedure. Any concerns you may have about receiving blood products, if you need them during the procedure. Cord blood banking, if you plan to collect your baby's umbilical cord blood. You may also want to ask your health care provider: Whether you will be able to hold or breastfeed your baby while you are still in the operating room. Whether your baby can stay with you immediately after the procedure and during your recovery. Whether a family member or a person of your choice can go with you into the operating room and stay with you during the procedure, immediately after the procedure, and during your recovery. What happens during the procedure?  An IV will be inserted into one of your veins. Fluid and medicines, such as antibiotics, will be given before the surgery. Fetal monitors will be placed on your abdomen to check your baby's heart rate. You may be given a warming gown to wear to keep your temperature stable. A catheter may be inserted into your bladder through your urethra. This drains your urine during the procedure. You may be given one or more of the following: A medicine to numb the area (local anesthetic). A medicine to make you fall asleep (general anesthetic). A medicine (regional anesthetic) that is injected into your back or through a small thin tube placed in your back (spinal anesthetic or epidural anesthetic). This numbs everything below the  injection site and allows you to stay awake during your procedure. If this makes you feel nauseous, tell your health care provider. Medicines will be available to help reduce any nausea you  may feel. An incision will be made in your abdomen, and then in your uterus. If you are awake during your procedure, you may feel tugging and pulling in your abdomen, but you should not feel pain. If you feel pain, tell your health care provider immediately. Your baby will be removed from your uterus. You may feel more pressure or pushing while this happens. Immediately after birth, your baby will be dried and kept warm. You may be able to hold and breastfeed your baby. The umbilical cord may be clamped and cut during this time. This usually occurs after waiting a period of 1-2 minutes after delivery. Your placenta will be removed from your uterus. Your incisions will be closed with stitches (sutures). Staples, skin glue, or adhesive strips may also be applied to the incision in your abdomen. Bandages (dressings) may be placed over the incision in your abdomen. The procedure may vary among health care providers and hospitals. What happens after the procedure? Your blood pressure, heart rate, breathing rate, and blood oxygen level will be monitored until you leave the hospital or clinic. You may continue to receive fluids and medicines through an IV. You will have some pain. Medicines will be available to help control your pain. To help prevent blood clots: You may be given medicines. You may have to wear compression stockings or devices. You will be encouraged to walk around when you are able. Hospital staff will encourage and support bonding with your baby. Your hospital may have you and your baby stay in the same room (rooming in) during your hospital stay to encourage successful bonding and breastfeeding. You may be encouraged to cough and breathe deeply often. This helps to prevent lung problems. If you have a catheter draining your urine, it will be removed as soon as possible after your procedure. Summary Cesarean birth, or cesarean delivery, is the surgical delivery of a baby through an  incision in the abdomen and the uterus. Follow instructions from your health care provider about eating or drinking restrictions before the procedure. You will have some pain after the procedure. Medicines will be available to help control your pain. Hospital staff will encourage and support bonding with your baby after the procedure. Your hospital may have you and your baby stay in the same room (rooming in) during your hospital stay to encourage successful bonding and breastfeeding. This information is not intended to replace advice given to you by your health care provider. Make sure you discuss any questions you have with your health care provider. Document Revised: 03/16/2021 Document Reviewed: 03/16/2021 Elsevier Patient Education  [place].

## 2022-06-01 NOTE — Progress Notes (Signed)
Pt reports of having nausea more often lately, wants to know if that's normal or not.

## 2022-06-01 NOTE — Progress Notes (Signed)
Subjective:  Heather Clements is a 40 y.o. G4P1021 at [redacted]w[redacted]d being seen today for ongoing prenatal care.  She is currently monitored for the following issues for this high-risk pregnancy and has OSA (obstructive sleep apnea); Migraine with aura and without status migrainosus, not intractable; Supervision of high risk pregnancy, antepartum; AMA (advanced maternal age) multigravida 35+; Chronic hypertension affecting pregnancy; History of cesarean delivery; and Unwanted fertility on their problem list.  Patient reports general discomforts of pregnancy.  Contractions: Not present. Vag. Bleeding: None.  Movement: Present. Denies leaking of fluid.   The following portions of the patient's history were reviewed and updated as appropriate: allergies, current medications, past family history, past medical history, past social history, past surgical history and problem list. Problem list updated.  Objective:   Vitals:   06/01/22 1112 06/01/22 1114  BP: (!) 135/91 123/82  Pulse: 95 89  Weight: 219 lb 8 oz (99.6 kg)     Fetal Status: Fetal Heart Rate (bpm): 150   Movement: Present     General:  Alert, oriented and cooperative. Patient is in no acute distress.  Skin: Skin is warm and dry. No rash noted.   Cardiovascular: Normal heart rate noted  Respiratory: Normal respiratory effort, no problems with respiration noted  Abdomen: Soft, gravid, appropriate for gestational age. Pain/Pressure: Present     Pelvic:  Cervical exam deferred        Extremities: Normal range of motion.  Edema: Trace  Mental Status: Normal mood and affect. Normal behavior. Normal judgment and thought content.   Urinalysis:      Assessment and Plan:  Pregnancy: G4P1021 at [redacted]w[redacted]d  1. Supervision of high risk pregnancy, antepartum Labor precautions  2. Multigravida of advanced maternal age in third trimester Stable  3. Chronic hypertension affecting pregnancy BP stable. Continue with current management Serial growth scans and  antenatal testing as per MFM  4. History of cesarean delivery For repeat 06/12/22  5. Unwanted fertility BTL papers signed  Term labor symptoms and general obstetric precautions including but not limited to vaginal bleeding, contractions, leaking of fluid and fetal movement were reviewed in detail with the patient. Please refer to After Visit Summary for other counseling recommendations.  Return in about 1 week (around 06/08/2022) for OB visit, face to face, MD only.   Chancy Milroy, MD

## 2022-06-02 ENCOUNTER — Telehealth (HOSPITAL_COMMUNITY): Payer: Self-pay | Admitting: *Deleted

## 2022-06-02 ENCOUNTER — Encounter (HOSPITAL_COMMUNITY): Payer: Self-pay

## 2022-06-02 NOTE — Patient Instructions (Signed)
Reighlyn Elmes  06/02/2022   Your procedure is scheduled on:  06/12/2022  Arrive at 0730 at [facility] on [address] at [facility]  and [address]. You are invited to use the FREE valet parking or use the Visitor's parking deck.  Pick up the phone at the desk and dial [phone].  Call this number if you have problems the morning of surgery: [phone]  Remember:   Do not eat food:(After Midnight) Desps de medianoche.  Do not drink clear liquids: (After Midnight) Desps de medianoche.  Take these medicines the morning of surgery with A SIP OF WATER:  Nifedipine as prescribed   Do not wear jewelry, make-up or nail polish.  Do not wear lotions, powders, or perfumes. Do not wear deodorant.  Do not shave 48 hours prior to surgery.  Do not bring valuables to the hospital.  [facility] is not   responsible for any belongings or valuables brought to the hospital.  Contacts, dentures or bridgework may not be worn into surgery.  Leave suitcase in the car. After surgery it may be brought to your room.  For patients admitted to the hospital, checkout time is 11:00 AM the day of              discharge.      Please read over the following fact sheets that you were given:     Preparing for Surgery

## 2022-06-02 NOTE — Telephone Encounter (Signed)
Preadmission screen  

## 2022-06-05 ENCOUNTER — Telehealth (HOSPITAL_COMMUNITY): Payer: Self-pay | Admitting: *Deleted

## 2022-06-05 NOTE — Telephone Encounter (Signed)
Preadmission screen  

## 2022-06-06 ENCOUNTER — Inpatient Hospital Stay (HOSPITAL_COMMUNITY): Payer: Medicaid Other | Admitting: Anesthesiology

## 2022-06-06 ENCOUNTER — Ambulatory Visit: Payer: Medicaid Other | Admitting: *Deleted

## 2022-06-06 ENCOUNTER — Encounter (HOSPITAL_COMMUNITY)
Admission: AD | Disposition: A | Discharge status: Home / Self Care | Payer: Self-pay | Source: Home / Self Care | Attending: Family Medicine

## 2022-06-06 ENCOUNTER — Ambulatory Visit (HOSPITAL_BASED_OUTPATIENT_CLINIC_OR_DEPARTMENT_OTHER): Payer: Medicaid Other | Admitting: Maternal & Fetal Medicine

## 2022-06-06 ENCOUNTER — Encounter (HOSPITAL_COMMUNITY): Payer: Self-pay | Admitting: Obstetrics & Gynecology

## 2022-06-06 ENCOUNTER — Other Ambulatory Visit: Payer: Self-pay

## 2022-06-06 ENCOUNTER — Inpatient Hospital Stay (HOSPITAL_COMMUNITY)
Admission: AD | Admit: 2022-06-06 | Discharge: 2022-06-08 | DRG: 784 | Disposition: A | Discharge status: Home / Self Care | Payer: Medicaid Other | Attending: Family Medicine | Admitting: Family Medicine

## 2022-06-06 ENCOUNTER — Ambulatory Visit (HOSPITAL_BASED_OUTPATIENT_CLINIC_OR_DEPARTMENT_OTHER): Payer: Medicaid Other

## 2022-06-06 ENCOUNTER — Other Ambulatory Visit: Payer: Self-pay | Admitting: Obstetrics

## 2022-06-06 VITALS — BP 139/81 | HR 105

## 2022-06-06 DIAGNOSIS — O0990 Supervision of high risk pregnancy, unspecified, unspecified trimester: Secondary | ICD-10-CM | POA: Insufficient documentation

## 2022-06-06 DIAGNOSIS — E669 Obesity, unspecified: Secondary | ICD-10-CM | POA: Diagnosis not present

## 2022-06-06 DIAGNOSIS — O34211 Maternal care for low transverse scar from previous cesarean delivery: Secondary | ICD-10-CM | POA: Diagnosis not present

## 2022-06-06 DIAGNOSIS — O10013 Pre-existing essential hypertension complicating pregnancy, third trimester: Secondary | ICD-10-CM

## 2022-06-06 DIAGNOSIS — O10919 Unspecified pre-existing hypertension complicating pregnancy, unspecified trimester: Secondary | ICD-10-CM

## 2022-06-06 DIAGNOSIS — G4733 Obstructive sleep apnea (adult) (pediatric): Secondary | ICD-10-CM | POA: Diagnosis present

## 2022-06-06 DIAGNOSIS — O10913 Unspecified pre-existing hypertension complicating pregnancy, third trimester: Secondary | ICD-10-CM

## 2022-06-06 DIAGNOSIS — O4103X0 Oligohydramnios, third trimester, not applicable or unspecified: Secondary | ICD-10-CM | POA: Diagnosis not present

## 2022-06-06 DIAGNOSIS — Z3A38 38 weeks gestation of pregnancy: Secondary | ICD-10-CM

## 2022-06-06 DIAGNOSIS — Z3A34 34 weeks gestation of pregnancy: Secondary | ICD-10-CM | POA: Diagnosis not present

## 2022-06-06 DIAGNOSIS — O99213 Obesity complicating pregnancy, third trimester: Secondary | ICD-10-CM

## 2022-06-06 DIAGNOSIS — Z79899 Other long term (current) drug therapy: Secondary | ICD-10-CM

## 2022-06-06 DIAGNOSIS — O1002 Pre-existing essential hypertension complicating childbirth: Secondary | ICD-10-CM | POA: Diagnosis present

## 2022-06-06 DIAGNOSIS — Z87891 Personal history of nicotine dependence: Secondary | ICD-10-CM

## 2022-06-06 DIAGNOSIS — Z302 Encounter for sterilization: Secondary | ICD-10-CM

## 2022-06-06 DIAGNOSIS — O09523 Supervision of elderly multigravida, third trimester: Secondary | ICD-10-CM | POA: Diagnosis not present

## 2022-06-06 DIAGNOSIS — Z23 Encounter for immunization: Secondary | ICD-10-CM | POA: Diagnosis not present

## 2022-06-06 DIAGNOSIS — O9952 Diseases of the respiratory system complicating childbirth: Secondary | ICD-10-CM | POA: Diagnosis not present

## 2022-06-06 DIAGNOSIS — O164 Unspecified maternal hypertension, complicating childbirth: Secondary | ICD-10-CM | POA: Diagnosis not present

## 2022-06-06 DIAGNOSIS — O34219 Maternal care for unspecified type scar from previous cesarean delivery: Secondary | ICD-10-CM

## 2022-06-06 DIAGNOSIS — O99214 Obesity complicating childbirth: Secondary | ICD-10-CM | POA: Diagnosis not present

## 2022-06-06 DIAGNOSIS — Z98891 History of uterine scar from previous surgery: Secondary | ICD-10-CM

## 2022-06-06 DIAGNOSIS — Z3009 Encounter for other general counseling and advice on contraception: Secondary | ICD-10-CM | POA: Diagnosis present

## 2022-06-06 LAB — CBC
HCT: 38.4 % (ref 36.0–46.0)
Hemoglobin: 13.2 g/dL (ref 12.0–15.0)
MCH: 31.6 pg (ref 26.0–34.0)
MCHC: 34.4 g/dL (ref 30.0–36.0)
MCV: 91.9 fL (ref 80.0–100.0)
Platelets: 289 10*3/uL (ref 150–400)
RBC: 4.18 MIL/uL (ref 3.87–5.11)
RDW: 14.4 % (ref 11.5–15.5)
WBC: 8.2 10*3/uL (ref 4.0–10.5)
nRBC: 0 % (ref 0.0–0.2)

## 2022-06-06 LAB — TYPE AND SCREEN
ABO/RH(D): A POS
Antibody Screen: NEGATIVE

## 2022-06-06 LAB — COMPREHENSIVE METABOLIC PANEL
ALT: 15 U/L (ref 0–44)
AST: 16 U/L (ref 15–41)
Albumin: 3 g/dL — ABNORMAL LOW (ref 3.5–5.0)
Alkaline Phosphatase: 152 U/L — ABNORMAL HIGH (ref 38–126)
Anion gap: 7 (ref 5–15)
BUN: <5 mg/dL — ABNORMAL LOW (ref 6–20)
CO2: 23 mmol/L (ref 22–32)
Calcium: 8.9 mg/dL (ref 8.9–10.3)
Chloride: 108 mmol/L (ref 98–111)
Creatinine, Ser: 0.68 mg/dL (ref 0.44–1.00)
GFR, Estimated: >60 mL/min (ref >60)
Glucose, Bld: 79 mg/dL (ref 70–99)
Potassium: 3.8 mmol/L (ref 3.5–5.1)
Sodium: 138 mmol/L (ref 135–145)
Total Bilirubin: 0.6 mg/dL (ref 0.3–1.2)
Total Protein: 5.9 g/dL — ABNORMAL LOW (ref 6.5–8.1)

## 2022-06-06 SURGERY — Surgical Case
Anesthesia: Spinal | Site: Abdomen

## 2022-06-06 MED ORDER — FENTANYL CITRATE (PF) 100 MCG/2ML IJ SOLN
INTRAMUSCULAR | Status: AC
  Filled 2022-06-06: qty 2

## 2022-06-06 MED ORDER — MORPHINE SULFATE (PF) 0.5 MG/ML IJ SOLN
INTRAMUSCULAR | Status: AC
  Filled 2022-06-06: qty 10

## 2022-06-06 MED ORDER — DEXAMETHASONE SODIUM PHOSPHATE 4 MG/ML IJ SOLN
INTRAMUSCULAR | Status: AC
  Filled 2022-06-06: qty 2

## 2022-06-06 MED ORDER — MORPHINE SULFATE (PF) 0.5 MG/ML IJ SOLN
INTRAMUSCULAR | Status: DC | PRN
  Administered 2022-06-06: 150 ug via INTRATHECAL

## 2022-06-06 MED ORDER — SODIUM CHLORIDE 0.9 % IR SOLN
Status: DC | PRN
  Administered 2022-06-06: 1000 mL

## 2022-06-06 MED ORDER — ACETAMINOPHEN 500 MG PO TABS
1000.0000 mg | ORAL_TABLET | Freq: Four times a day (QID) | ORAL | Status: DC
  Administered 2022-06-06 – 2022-06-08 (×7): 1000 mg via ORAL
  Filled 2022-06-06 (×7): qty 2

## 2022-06-06 MED ORDER — NALOXONE HCL 0.4 MG/ML IJ SOLN: 0.4000 mg | INTRAMUSCULAR | Status: DC | PRN

## 2022-06-06 MED ORDER — DIPHENHYDRAMINE HCL 25 MG PO CAPS
25.0000 mg | ORAL_CAPSULE | Freq: Four times a day (QID) | ORAL | Status: DC | PRN
  Administered 2022-06-06 – 2022-06-07 (×2): 25 mg via ORAL
  Filled 2022-06-06 (×2): qty 1

## 2022-06-06 MED ORDER — MENTHOL 3 MG MT LOZG: 1.0000 | LOZENGE | OROMUCOSAL | Status: DC | PRN

## 2022-06-06 MED ORDER — KETOROLAC TROMETHAMINE 30 MG/ML IJ SOLN: 30.0000 mg | Freq: Once | INTRAMUSCULAR | Status: DC

## 2022-06-06 MED ORDER — OXYTOCIN-SODIUM CHLORIDE 30-0.9 UT/500ML-% IV SOLN
INTRAVENOUS | Status: AC
  Filled 2022-06-06: qty 500

## 2022-06-06 MED ORDER — GABAPENTIN 100 MG PO CAPS
200.0000 mg | ORAL_CAPSULE | Freq: Every day | ORAL | Status: DC
  Administered 2022-06-06 – 2022-06-07 (×2): 200 mg via ORAL
  Filled 2022-06-06 (×2): qty 2

## 2022-06-06 MED ORDER — COCONUT OIL OIL
1.0000 | TOPICAL_OIL | Status: DC | PRN
  Administered 2022-06-08: 1 via TOPICAL

## 2022-06-06 MED ORDER — OXYTOCIN-SODIUM CHLORIDE 30-0.9 UT/500ML-% IV SOLN
2.5000 [IU]/h | INTRAVENOUS | Status: AC
  Administered 2022-06-06: 2.5 [IU]/h via INTRAVENOUS
  Filled 2022-06-06: qty 500

## 2022-06-06 MED ORDER — NIFEDIPINE ER OSMOTIC RELEASE 30 MG PO TB24
30.0000 mg | ORAL_TABLET | Freq: Every day | ORAL | Status: DC
  Administered 2022-06-06 – 2022-06-08 (×3): 30 mg via ORAL
  Filled 2022-06-06 (×3): qty 1

## 2022-06-06 MED ORDER — OXYCODONE HCL 5 MG PO TABS: 5.0000 mg | ORAL_TABLET | ORAL | Status: DC | PRN

## 2022-06-06 MED ORDER — SIMETHICONE 80 MG PO CHEW
80.0000 mg | CHEWABLE_TABLET | Freq: Three times a day (TID) | ORAL | Status: DC
  Administered 2022-06-07 – 2022-06-08 (×5): 80 mg via ORAL
  Filled 2022-06-06 (×5): qty 1

## 2022-06-06 MED ORDER — KETOROLAC TROMETHAMINE 30 MG/ML IJ SOLN: 30.0000 mg | Freq: Four times a day (QID) | INTRAMUSCULAR | Status: DC | PRN

## 2022-06-06 MED ORDER — METOCLOPRAMIDE HCL 5 MG/ML IJ SOLN
INTRAMUSCULAR | Status: AC
  Filled 2022-06-06: qty 2

## 2022-06-06 MED ORDER — KETOROLAC TROMETHAMINE 30 MG/ML IJ SOLN
INTRAMUSCULAR | Status: AC
  Filled 2022-06-06: qty 1

## 2022-06-06 MED ORDER — ACETAMINOPHEN 10 MG/ML IV SOLN
INTRAVENOUS | Status: DC | PRN
  Administered 2022-06-06: 1000 mg via INTRAVENOUS

## 2022-06-06 MED ORDER — IBUPROFEN 600 MG PO TABS
600.0000 mg | ORAL_TABLET | Freq: Four times a day (QID) | ORAL | Status: DC
  Administered 2022-06-07 – 2022-06-08 (×4): 600 mg via ORAL
  Filled 2022-06-06 (×4): qty 1

## 2022-06-06 MED ORDER — ENOXAPARIN SODIUM 60 MG/0.6ML IJ SOSY
50.0000 mg | PREFILLED_SYRINGE | INTRAMUSCULAR | Status: DC
  Administered 2022-06-07 – 2022-06-08 (×2): 50 mg via SUBCUTANEOUS
  Filled 2022-06-06 (×2): qty 0.6

## 2022-06-06 MED ORDER — SODIUM CHLORIDE 0.9% FLUSH: 3.0000 mL | INTRAVENOUS | Status: DC | PRN

## 2022-06-06 MED ORDER — ACETAMINOPHEN 500 MG PO TABS: 1000.0000 mg | ORAL_TABLET | Freq: Four times a day (QID) | ORAL | Status: DC

## 2022-06-06 MED ORDER — ZOLPIDEM TARTRATE 5 MG PO TABS: 5.0000 mg | ORAL_TABLET | Freq: Every evening | ORAL | Status: DC | PRN

## 2022-06-06 MED ORDER — ONDANSETRON HCL 4 MG/2ML IJ SOLN: 4.0000 mg | Freq: Once | INTRAMUSCULAR | Status: DC | PRN

## 2022-06-06 MED ORDER — SENNOSIDES-DOCUSATE SODIUM 8.6-50 MG PO TABS
2.0000 | ORAL_TABLET | Freq: Every day | ORAL | Status: DC
  Administered 2022-06-07 – 2022-06-08 (×2): 2 via ORAL
  Filled 2022-06-06 (×2): qty 2

## 2022-06-06 MED ORDER — SIMETHICONE 80 MG PO CHEW: 80.0000 mg | CHEWABLE_TABLET | ORAL | Status: DC | PRN

## 2022-06-06 MED ORDER — DIBUCAINE (PERIANAL) 1 % EX OINT: 1.0000 | TOPICAL_OINTMENT | CUTANEOUS | Status: DC | PRN

## 2022-06-06 MED ORDER — PRENATAL MULTIVITAMIN CH
1.0000 | ORAL_TABLET | Freq: Every day | ORAL | Status: DC
  Administered 2022-06-07 – 2022-06-08 (×2): 1 via ORAL
  Filled 2022-06-06 (×2): qty 1

## 2022-06-06 MED ORDER — SCOPOLAMINE 1 MG/3DAYS TD PT72
MEDICATED_PATCH | TRANSDERMAL | Status: AC
  Filled 2022-06-06: qty 1

## 2022-06-06 MED ORDER — DIPHENHYDRAMINE HCL 25 MG PO CAPS
25.0000 mg | ORAL_CAPSULE | ORAL | Status: DC | PRN
  Administered 2022-06-07 (×2): 25 mg via ORAL
  Filled 2022-06-06 (×2): qty 1

## 2022-06-06 MED ORDER — MEPERIDINE HCL 25 MG/ML IJ SOLN: 6.2500 mg | INTRAMUSCULAR | Status: DC | PRN

## 2022-06-06 MED ORDER — HYDROMORPHONE HCL 1 MG/ML IJ SOLN: 0.2500 mg | INTRAMUSCULAR | Status: DC | PRN

## 2022-06-06 MED ORDER — OXYCODONE HCL 5 MG/5ML PO SOLN: 5.0000 mg | Freq: Once | ORAL | Status: DC | PRN

## 2022-06-06 MED ORDER — SCOPOLAMINE 1 MG/3DAYS TD PT72
1.0000 | MEDICATED_PATCH | Freq: Once | TRANSDERMAL | Status: DC
  Administered 2022-06-06: 1.5 mg via TRANSDERMAL

## 2022-06-06 MED ORDER — NALOXONE HCL 4 MG/10ML IJ SOLN: 1.0000 ug/kg/h | INTRAVENOUS | Status: DC | PRN

## 2022-06-06 MED ORDER — LACTATED RINGERS IV SOLN: INTRAVENOUS | Status: DC | PRN

## 2022-06-06 MED ORDER — ONDANSETRON HCL 4 MG/2ML IJ SOLN: 4.0000 mg | Freq: Three times a day (TID) | INTRAMUSCULAR | Status: DC | PRN

## 2022-06-06 MED ORDER — PHENYLEPHRINE HCL-NACL 20-0.9 MG/250ML-% IV SOLN
INTRAVENOUS | Status: DC | PRN
  Administered 2022-06-06: 60 ug/min via INTRAVENOUS

## 2022-06-06 MED ORDER — ACETAMINOPHEN 10 MG/ML IV SOLN
INTRAVENOUS | Status: AC
  Filled 2022-06-06: qty 100

## 2022-06-06 MED ORDER — ONDANSETRON HCL 4 MG/2ML IJ SOLN
INTRAMUSCULAR | Status: AC
  Filled 2022-06-06: qty 2

## 2022-06-06 MED ORDER — PHENYLEPHRINE 80 MCG/ML (10ML) SYRINGE FOR IV PUSH (FOR BLOOD PRESSURE SUPPORT)
PREFILLED_SYRINGE | INTRAVENOUS | Status: AC
  Filled 2022-06-06: qty 10

## 2022-06-06 MED ORDER — OXYCODONE HCL 5 MG PO TABS: 5.0000 mg | ORAL_TABLET | Freq: Once | ORAL | Status: DC | PRN

## 2022-06-06 MED ORDER — KETOROLAC TROMETHAMINE 30 MG/ML IJ SOLN: 30.0000 mg | Freq: Once | INTRAMUSCULAR | Status: DC | PRN

## 2022-06-06 MED ORDER — STERILE WATER FOR IRRIGATION IR SOLN
Status: DC | PRN
  Administered 2022-06-06: 1000 mL

## 2022-06-06 MED ORDER — SOD CITRATE-CITRIC ACID 500-334 MG/5ML PO SOLN
30.0000 mL | ORAL | Status: AC
  Administered 2022-06-06: 30 mL via ORAL
  Filled 2022-06-06: qty 30

## 2022-06-06 MED ORDER — DIPHENHYDRAMINE HCL 50 MG/ML IJ SOLN: 12.5000 mg | INTRAMUSCULAR | Status: DC | PRN

## 2022-06-06 MED ORDER — TETANUS-DIPHTH-ACELL PERTUSSIS 5-2.5-18.5 LF-MCG/0.5 IM SUSY
0.5000 mL | PREFILLED_SYRINGE | Freq: Once | INTRAMUSCULAR | Status: AC
  Administered 2022-06-08: 0.5 mL via INTRAMUSCULAR
  Filled 2022-06-06: qty 0.5

## 2022-06-06 MED ORDER — WITCH HAZEL-GLYCERIN EX PADS: 1.0000 | MEDICATED_PAD | CUTANEOUS | Status: DC | PRN

## 2022-06-06 MED ORDER — TRANEXAMIC ACID-NACL 1000-0.7 MG/100ML-% IV SOLN
INTRAVENOUS | Status: AC
  Filled 2022-06-06: qty 100

## 2022-06-06 MED ORDER — CEFAZOLIN SODIUM-DEXTROSE 2-4 GM/100ML-% IV SOLN
2.0000 g | INTRAVENOUS | Status: AC
  Administered 2022-06-06: 2 g via INTRAVENOUS
  Filled 2022-06-06: qty 100

## 2022-06-06 MED ORDER — TRANEXAMIC ACID-NACL 1000-0.7 MG/100ML-% IV SOLN
1000.0000 mg | INTRAVENOUS | Status: AC
  Administered 2022-06-06: 1000 mg via INTRAVENOUS

## 2022-06-06 MED ORDER — KETOROLAC TROMETHAMINE 30 MG/ML IJ SOLN
INTRAMUSCULAR | Status: DC | PRN
  Administered 2022-06-06: 30 mg via INTRAVENOUS

## 2022-06-06 MED ORDER — AMISULPRIDE (ANTIEMETIC) 5 MG/2ML IV SOLN: 10.0000 mg | Freq: Once | INTRAVENOUS | Status: DC | PRN

## 2022-06-06 MED ORDER — PHENYLEPHRINE HCL-NACL 20-0.9 MG/250ML-% IV SOLN
INTRAVENOUS | Status: AC
  Filled 2022-06-06: qty 250

## 2022-06-06 MED ORDER — ONDANSETRON HCL 4 MG/2ML IJ SOLN
INTRAMUSCULAR | Status: DC | PRN
  Administered 2022-06-06: 4 mg via INTRAVENOUS

## 2022-06-06 MED ORDER — DEXAMETHASONE SODIUM PHOSPHATE 10 MG/ML IJ SOLN
INTRAMUSCULAR | Status: DC | PRN
  Administered 2022-06-06: 8 mg via INTRAVENOUS

## 2022-06-06 MED ORDER — OXYTOCIN-SODIUM CHLORIDE 30-0.9 UT/500ML-% IV SOLN
INTRAVENOUS | Status: DC | PRN
  Administered 2022-06-06: 300 mL via INTRAVENOUS

## 2022-06-06 MED ORDER — BUPIVACAINE IN DEXTROSE 0.75-8.25 % IT SOLN
INTRATHECAL | Status: DC | PRN
  Administered 2022-06-06: 1.5 mL via INTRATHECAL

## 2022-06-06 MED ORDER — FENTANYL CITRATE (PF) 100 MCG/2ML IJ SOLN
INTRAMUSCULAR | Status: DC | PRN
  Administered 2022-06-06: 15 ug via INTRATHECAL

## 2022-06-06 MED ORDER — CEFAZOLIN SODIUM-DEXTROSE 2-4 GM/100ML-% IV SOLN
INTRAVENOUS | Status: AC
  Filled 2022-06-06: qty 100

## 2022-06-06 MED ORDER — KETOROLAC TROMETHAMINE 30 MG/ML IJ SOLN
30.0000 mg | Freq: Four times a day (QID) | INTRAMUSCULAR | Status: AC
  Administered 2022-06-06 – 2022-06-07 (×3): 30 mg via INTRAVENOUS
  Filled 2022-06-06 (×3): qty 1

## 2022-06-06 MED ORDER — MEDROXYPROGESTERONE ACETATE 150 MG/ML IM SUSP: 150.0000 mg | INTRAMUSCULAR | Status: DC | PRN

## 2022-06-06 SURGICAL SUPPLY — 34 items
CHLORAPREP W/TINT 26ML (MISCELLANEOUS) ×2 IMPLANT
CLAMP UMBILICAL CORD (MISCELLANEOUS) ×1 IMPLANT
CLOTH BEACON ORANGE TIMEOUT ST (SAFETY) ×1 IMPLANT
DERMABOND ADVANCED .7 DNX12 (GAUZE/BANDAGES/DRESSINGS) IMPLANT
DRSG OPSITE POSTOP 4X10 (GAUZE/BANDAGES/DRESSINGS) ×1 IMPLANT
ELECT REM PT RETURN 9FT ADLT (ELECTROSURGICAL) ×1
ELECTRODE REM PT RTRN 9FT ADLT (ELECTROSURGICAL) ×1 IMPLANT
EXTRACTOR VACUUM BELL STYLE (SUCTIONS) IMPLANT
GLOVE BIOGEL PI IND STRL 7.0 (GLOVE) ×2 IMPLANT
GLOVE ECLIPSE 7.0 STRL STRAW (GLOVE) ×1 IMPLANT
GOWN STRL REUS W/TWL LRG LVL3 (GOWN DISPOSABLE) ×2 IMPLANT
KIT ABG SYR 3ML LUER SLIP (SYRINGE) ×1 IMPLANT
LIGASURE IMPACT 36 18CM CVD LR (INSTRUMENTS) IMPLANT
NDL HYPO 25X5/8 SAFETYGLIDE (NEEDLE) ×1 IMPLANT
NEEDLE HYPO 25X5/8 SAFETYGLIDE (NEEDLE) ×1 IMPLANT
NS IRRIG 1000ML POUR BTL (IV SOLUTION) ×1 IMPLANT
PACK C SECTION WH (CUSTOM PROCEDURE TRAY) ×1 IMPLANT
PAD OB MATERNITY 4.3X12.25 (PERSONAL CARE ITEMS) ×1 IMPLANT
RETAINER VISCERAL (MISCELLANEOUS) IMPLANT
RTRCTR C-SECT PINK 25CM LRG (MISCELLANEOUS) ×1 IMPLANT
SUT MNCRL 0 VIOLET CTX 36 (SUTURE) ×2 IMPLANT
SUT MONOCRYL 0 CTX 36 (SUTURE) ×2
SUT PLAIN 0 NONE (SUTURE) IMPLANT
SUT PLAIN 2 0 (SUTURE)
SUT PLAIN 2 0 XLH (SUTURE) IMPLANT
SUT PLAIN ABS 2-0 CT1 27XMFL (SUTURE) IMPLANT
SUT VIC AB 0 CTX 36 (SUTURE) ×1
SUT VIC AB 0 CTX36XBRD ANBCTRL (SUTURE) ×1 IMPLANT
SUT VIC AB 2-0 CT1 27 (SUTURE)
SUT VIC AB 2-0 CT1 TAPERPNT 27 (SUTURE) IMPLANT
SUT VIC AB 4-0 KS 27 (SUTURE) ×1 IMPLANT
TOWEL OR 17X24 6PK STRL BLUE (TOWEL DISPOSABLE) ×1 IMPLANT
TRAY FOLEY W/BAG SLVR 14FR LF (SET/KITS/TRAYS/PACK) IMPLANT
WATER STERILE IRR 1000ML POUR (IV SOLUTION) ×1 IMPLANT

## 2022-06-06 NOTE — Lactation Note (Signed)
This note was copied from a baby's chart. Lactation Consultation Note  Patient Name: Heather Clements MRAJH'H Date: 06/06/2022 Reason for consult: Initial assessment;Early term 37-38.6wks Age:43 hours Mom stated BF going well. Mom denies painful latches, states the baby is BF good a lot better than her 1st child did. Newborn feeding habits, STS, I&O, support, supply and demand reviewed. Answered mom's questions. Baby has been a little gaggy per mom. Encouraged mom to call for [facility] for next latch. Mom stated the baby is latching good. LC suggested that I need to document a latch for the Dr. Before she goes home. Mom stated she will call. Mom is itching so much she's uncomfortable.   Maternal Data Has patient been taught Hand Expression?: Yes Does the patient have breastfeeding experience prior to this delivery?: Yes How long did the patient breastfeed?: 1 1/2 yrs  Feeding    LATCH Score       Type of Nipple: Everted at rest and after stimulation  Comfort (Breast/Nipple): Soft / non-tender         Lactation Tools Discussed/Used    Interventions Interventions: Breast feeding basics reviewed;Expressed milk;LC Services brochure  Discharge    Consult Status Consult Status: Follow-up Date: 06/07/22 Follow-up type: In-patient    Theodoro Kalata 06/06/2022, 10:45 PM

## 2022-06-06 NOTE — H&P (Signed)
Faculty Practice H&P  Heather Clements is a 41 y.o. female [phone] with IUP at [redacted]w[redacted]d sent from MFM office due to nonreassuring fetal testing with a BPP 6/10. MFM recommends delivery. Her pregnancy is complicated by CHTN, OSA, h/o c/s. She reports good fetal movement.    Pt states she has been having no contractions, no vaginal bleeding, intact membranes, with normal fetal movement.     Prenatal Course Source of Care: [facility] with onset of care at [facility]  Pregnancy complications or risks: Patient Active Problem List   Diagnosis Date Noted   Unwanted fertility 05/25/2022   History of cesarean delivery 12/19/2021   Supervision of high risk pregnancy, antepartum 12/08/2021   AMA (advanced maternal age) multigravida 35+ 12/08/2021   Chronic hypertension affecting pregnancy 12/08/2021   Migraine with aura and without status migrainosus, not intractable 05/23/2018   OSA (obstructive sleep apnea) 04/30/2014   She desires bilateral tubal ligation for contraception.  She plans to breastfeed  Prenatal labs and studies: ABO, Rh: A/Positive/-- (04/13 1035) Antibody: Negative (04/13 1035) Rubella: 2.31 (04/13 1035) RPR: Non Reactive (08/02 0932)  HBsAg: Negative (04/13 1035)  HIV: Non Reactive (08/02 0932)  GBS: Negative/-- (09/28 1327)  2hr Glucola: negative Genetic screening: normal Anatomy US: normal  Past Medical History:  Past Medical History:  Diagnosis Date   Headache     Past Surgical History:  Past Surgical History:  Procedure Laterality Date   CESAREAN SECTION     PANNICULECTOMY      Obstetrical History:  OB History     Gravida  4   Para  1   Term  1   Preterm  0   AB  2   Living  1      SAB  2   IAB  0   Ectopic  0   Multiple  0   Live Births  1           Gynecological History:  OB History     Gravida  4   Para  1   Term  1   Preterm  0   AB  2   Living  1      SAB  2   IAB  0   Ectopic  0   Multiple  0   Live Births   1           Social History:  Social History   Socioeconomic History   Marital status: Single    Spouse name: Not on file   Number of children: Not on file   Years of education: Not on file   Highest education level: Not on file  Occupational History   Not on file  Tobacco Use   Smoking status: Former    Types: Cigarettes    Passive exposure: Never   Smokeless tobacco: Never  Vaping Use   Vaping Use: Never used  Substance and Sexual Activity   Alcohol use: Not Currently   Drug use: Not Currently    Types: Marijuana    Comment: last use of weed aug 2023   Sexual activity: Not Currently    Birth control/protection: None  Other Topics Concern   Not on file  Social History Narrative   Not on file   Social Determinants of Health   Financial Resource Strain: Not on file  Food Insecurity: No Food Insecurity (12/19/2021)   Hunger Vital Sign    Worried About Running Out of Food in the Last Year: Never  true    Ran Out of Food in the Last Year: Never true  Transportation Needs: No Transportation Needs (12/19/2021)   PRAPARE - Administrator, Civil Service (Medical): No    Lack of Transportation (Non-Medical): No  Physical Activity: Not on file  Stress: Not on file  Social Connections: Not on file    Family History:  Family History  Problem Relation Age of Onset   Hypertension Mother    Cancer Mother    Asthma Neg Hx    Birth defects Neg Hx    Diabetes Neg Hx    Heart disease Neg Hx    Stroke Neg Hx     Medications:  Prenatal vitamins,  No current facility-administered medications for this encounter.    Allergies:  Allergies  Allergen Reactions   Latex Itching    Review of Systems: - negative  Physical Exam: Blood pressure 120/76, pulse 87, temperature 98.1 F (36.7 C), temperature source Oral, resp. rate 19, height 5\' 2"  (1.575 m), weight 99.6 kg, last menstrual period 08/27/2021, SpO2 100 %. GENERAL: Well-developed, well-nourished female  in no acute distress.  LUNGS: Clear to auscultation bilaterally.  HEART: Regular rate and rhythm. ABDOMEN: Soft, nontender, nondistended, gravid. EXTREMITIES: Nontender, no edema, 2+ distal pulses. Presentation: cephalic FHT:  Baseline rate 140 bpm   Variability moderate  Accelerations present   Decelerations none Contractions: none   Pertinent Labs/Studies:   Lab Results  Component Value Date   WBC 9.0 03/29/2022   HGB 11.9 03/29/2022   HCT 35.6 03/29/2022   MCV 92 03/29/2022   PLT 289 03/29/2022    Assessment : Heather Clements is a 41 y.o. G4P1021 at [redacted]w[redacted]d being admitted for cesarean section with tubal ligation secondary to elective repeat, nonreassuring fetal testing.  Plan: The risks of cesarean section discussed with the patient included but were not limited to: bleeding which may require transfusion or reoperation; infection which may require antibiotics; injury to bowel, bladder, ureters or other surrounding organs; injury to the fetus; need for additional procedures including hysterectomy in the event of a life-threatening hemorrhage; placental abnormalities wth subsequent pregnancies, incisional problems, thromboembolic phenomenon and other postoperative/anesthesia complications. The patient concurred with the proposed plan, giving informed written consent for the procedure.   Patient has been NPO since 8 AM and will remain NPO for procedure.  Preoperative prophylactic Ancef ordered on call to the OR.    [redacted]w[redacted]d, DO 06/06/2022, 12:27 PM

## 2022-06-06 NOTE — Op Note (Signed)
PROCEDURE DATE: 06/06/2022  PREOPERATIVE DIAGNOSIS: Intrauterine pregnancy at  [redacted]w[redacted]d weeks gestation; non-reassuring fetal status and previous C-section , undesired fertility  POSTOPERATIVE DIAGNOSIS: The same  PROCEDURE: Repeat Low Transverse Cesarean Section with bilateral tubal ligation  SURGEON:  Dr. Loma Boston  ASSISTANT: Dr. Liliane Channel.  An experienced assistant was required given the standard of surgical care given the complexity of the case.  This assistant was needed for exposure, dissection, suctioning, retraction, instrument exchange, assisting with delivery with administration of fundal pressure, and for overall help during the procedure.  INDICATIONS: Heather Clements is a 41 y.o. [id number] at [redacted]w[redacted]d scheduled for cesarean section secondary to non-reassuring fetal status and previous Cesarean section .  The risks of cesarean section discussed with the patient included but were not limited to: bleeding which may require transfusion or reoperation; infection which may require antibiotics; injury to bowel, bladder, ureters or other surrounding organs; injury to the fetus; need for additional procedures including hysterectomy in the event of a life-threatening hemorrhage; placental abnormalities wth subsequent pregnancies, incisional problems, thromboembolic phenomenon and other postoperative/anesthesia complications. The patient concurred with the proposed plan, giving informed written consent for the procedure.    FINDINGS:  Viable female infant in cephalic presentation.  Apgars 8 and 9, weight, 6 pounds and 12.3 ounces.  Clear amniotic fluid.  Intact placenta, three vessel cord.  Normal uterus, fallopian tubes and ovaries bilaterally.  ANESTHESIA:    Spinal INTRAVENOUS FLUIDS:1300 ml ESTIMATED BLOOD LOSS: 740 ml URINE OUTPUT:  50 ml SPECIMENS: Placenta sent to pathology COMPLICATIONS: None immediate  PROCEDURE IN DETAIL:  The patient received intravenous antibiotics and had  sequential compression devices applied to her lower extremities while in the preoperative area.  She was then taken to the operating room where spinal anesthesia was administered (epidural anesthesia was dosed up to surgical level) and was found to be adequate. She was then placed in a dorsal supine position with a leftward tilt, and prepped and draped in a sterile manner.  A foley catheter was placed into her bladder and attached to constant gravity, which drained clear fluid throughout.    After an adequate timeout was performed, a Pfannenstiel skin incision was made with scalpel and carried through to the underlying layer of fascia. The fascia was incised in the midline and this incision was extended bilaterally using the Mayo scissors. Kocher clamps were applied to the superior aspect of the fascial incision and the underlying rectus muscles were dissected off bluntly. A similar process was carried out on the inferior aspect of the facial incision. The rectus muscles were separated in the midline bluntly and the peritoneum was entered bluntly. An Alexis retractor was placed to aid in visualization of the uterus.  Attention was turned to the lower uterine segment where a transverse hysterotomy was made with a scalpel and extended bilaterally bluntly. The infant was successfully delivered, and cord was clamped and cut and infant was handed over to awaiting neonatology team. Uterine massage was then administered and the placenta delivered intact with three-vessel cord. The uterus was then cleared of clot and debris.  The hysterotomy was closed in a single layer with 0 Vicryl in a running unlocked fashion.   The uterus was exteriorized, and attention was turned to the adnexa. The right Fallopian tube was identified by tracing out to the fimbraie, grasped with the Babcock clamps. A salpingectomy was performed in the usual fashion using ligasure. The same procedure was repeated on the left fallopian tube.  Excellent hemostasis  was observed in bilateral adnexae.   Overall, excellent hemostasis was noted. The abdomen and the pelvis were cleared of all clot and debris and the Ubaldo Glassing was removed. Hemostasis was confirmed on all surfaces.  The peritoneum was reapproximated using 2-0 vicryl running stitches. The fascia was then closed using 0 Vicryl in a running fashion. The subcutaneous layer was reapproximated with plain gut and the skin was closed with 4-0 vicryl. The patient tolerated the procedure well. Sponge, lap, instrument and needle counts were correct x 2. She was taken to the recovery room in stable condition.   Liliane Channel MD MPH OB Fellow, [place] [facility] [place] 06/06/2022

## 2022-06-06 NOTE — Discharge Summary (Signed)
Postpartum Discharge Summary  Date of Service updated - yes     Patient Name: Georgina Krist DOB: [birth year] MRN: [id number]  Date of admission: 06/06/2022 Delivery date:06/06/2022  Delivering provider: Truett Mainland  Date of discharge: 06/08/2022  Admitting diagnosis: Encounter for maternal care for low transverse scar from repeat cesarean delivery [O34.211] History of cesarean delivery [Z98.891] Status post repeat low transverse cesarean section [Z98.891] Intrauterine pregnancy: [redacted]w[redacted]d    Secondary diagnosis:  Principal Problem:   Encounter for maternal care for low transverse scar from repeat cesarean delivery Active Problems:   Chronic hypertension affecting pregnancy   History of cesarean delivery   Unwanted fertility   Status post cesarean delivery   Status post repeat low transverse cesarean section   Status post tubal ligation at time of delivery, current hosp  Additional problems: none    Discharge diagnosis: Term Pregnancy Delivered and CHTN                                              Post partum procedures:postpartum tubal ligation - during C -section Augmentation: N/A Complications: None  Hospital course: Sceduled C/S   41y.o. yo [id number] admitted to the hospital 06/06/2022 for scheduled cesarean section with the following indication:Elective Repeat and Non-Reassuring FHR.Delivery details are as follows:  Membrane Rupture Time/Date: 3:47 PM ,06/06/2022   Delivery Method:C-Section, Low Transverse  Details of operation can be found in separate operative note.  Patient had a postpartum course complicated by none. Blood pressures well controlled postpartum. She was started on a 5 day course of lasix which she will be discharged on.  She is ambulating, tolerating a regular diet, passing flatus, and urinating well. Patient is discharged home in stable condition on  06/08/22        Newborn Data: Birth date:06/06/2022  Birth time:3:47 PM  Gender:Female   Living status:Living  Apgars:8 ,9  Weight:3070 g     Magnesium Sulfate received: No BMZ received: No Rhophylac:N/A MMR:N/A, Immune T-DaP:Given postpartum Flu: No Transfusion:No  Physical exam  Vitals:   06/07/22 0830 06/07/22 1510 06/07/22 2108 06/08/22 0500  BP: 119/80 119/75 126/82 122/78  Pulse: 65 70 82 80  Resp: 18 18 19 18   Temp: 97.9 F (36.6 C) 98.3 F (36.8 C) 97.6 F (36.4 C) 98 F (36.7 C)  TempSrc: Oral Oral Oral Oral  SpO2: 99% 100% 100% 100%  Weight:      Height:       General: alert, cooperative, and no distress Lochia: appropriate Uterine Fundus: firm Incision: Dressing is clean, dry, and intact DVT Evaluation: No evidence of DVT seen on physical exam. Labs: Lab Results  Component Value Date   WBC 18.1 (H) 06/07/2022   HGB 11.6 (L) 06/07/2022   HCT 35.3 (L) 06/07/2022   MCV 93.4 06/07/2022   PLT 284 06/07/2022      Latest Ref Rng & Units 06/06/2022    2:48 PM  CMP  Glucose 70 - 99 mg/dL 79   BUN 6 - 20 mg/dL <5   Creatinine 0.44 - 1.00 mg/dL 0.68   Sodium 135 - 145 mmol/L 138   Potassium 3.5 - 5.1 mmol/L 3.8   Chloride 98 - 111 mmol/L 108   CO2 22 - 32 mmol/L 23   Calcium 8.9 - 10.3 mg/dL 8.9   Total Protein 6.5 -  8.1 g/dL 5.9   Total Bilirubin 0.3 - 1.2 mg/dL 0.6   Alkaline Phos 38 - 126 U/L 152   AST 15 - 41 U/L 16   ALT 0 - 44 U/L 15    Edinburgh Score:    06/07/2022    8:30 AM  Edinburgh Postnatal Depression Scale Screening Tool  I have been able to laugh and see the funny side of things. 0  I have looked forward with enjoyment to things. 0  I have blamed myself unnecessarily when things went wrong. 0  I have been anxious or worried for no good reason. 0  I have felt scared or panicky for no good reason. 0  Things have been getting on top of me. 0  I have been so unhappy that I have had difficulty sleeping. 0  I have felt sad or miserable. 0  I have been so unhappy that I have been crying. 0  The thought of harming myself  has occurred to me. 0  Edinburgh Postnatal Depression Scale Total 0     After visit meds:  Allergies as of 06/08/2022       Reactions   Latex Itching        Medication List     STOP taking these medications    aspirin EC 81 MG tablet   NIFEdipine 30 MG 24 hr tablet Commonly known as: PROCARDIA-XL/NIFEDICAL-XL       TAKE these medications    furosemide 20 MG tablet Commonly known as: LASIX Take 1 tablet (20 mg total) by mouth daily for 4 days. Start taking on: June 09, 2022   ibuprofen 600 MG tablet Commonly known as: ADVIL Take 1 tablet (600 mg total) by mouth every 6 (six) hours.   oxyCODONE 5 MG immediate release tablet Commonly known as: Oxy IR/ROXICODONE Take 1 tablet (5 mg total) by mouth every 6 (six) hours as needed for breakthrough pain or severe pain.   PrePLUS 27-1 MG Tabs Take 1 tablet by mouth daily.         Discharge home in stable condition Infant Feeding: Breast Infant Disposition:home with mother Discharge instruction: per After Visit Summary and Postpartum booklet. Activity: Advance as tolerated. Pelvic rest for 6 weeks.  Diet: routine diet Future Appointments: Future Appointments  Date Time Provider [place]  06/14/2022  9:00 AM [facility] NURSE [facility]  07/17/2022  3:15 PM Chancy Milroy, MD [facility]   Follow up Visit: follow up in 1 week for incision check and in 6 weeks for post partum visit as scheduled.  Liliane Channel MD MPH OB Fellow, [place] for [place] 06/08/2022

## 2022-06-06 NOTE — MAU Note (Signed)
Heather Clements is a 41 y.o. at [redacted]w[redacted]d here in MAU reporting: she was seen in office today, fluid lower than should be and needs to deliver today.  Denies VB or LOF.  Endorses +FM. LMP: N/A Onset of complaint: today Pain score: 0 Vitals:   06/06/22 1120  BP: 125/83  Pulse: 95  Resp: 19  Temp: 98.1 F (36.7 C)  SpO2: 100%     FHT:152 bpm Lab orders placed from triage:   None

## 2022-06-06 NOTE — MAU Provider Note (Addendum)
Faculty Practice H&P  Heather Clements is a 41 y.o. female G4P1021 with IUP at [redacted]w[redacted]d sent from MFM office due to nonreassuring fetal testing with a BPP 6/10. MFM recommends delivery. Her pregnancy is complicated by CHTN, OSA, h/o c/s. She reports good fetal movement.    Pt states she has been having no contractions, no vaginal bleeding, intact membranes, with normal fetal movement.     Prenatal Course Source of Care: [facility] with onset of care at 14weeks  Pregnancy complications or risks: Patient Active Problem List   Diagnosis Date Noted   Unwanted fertility 05/25/2022   History of cesarean delivery 12/19/2021   Supervision of high risk pregnancy, antepartum 12/08/2021   AMA (advanced maternal age) multigravida 35+ 12/08/2021   Chronic hypertension affecting pregnancy 12/08/2021   Migraine with aura and without status migrainosus, not intractable 05/23/2018   OSA (obstructive sleep apnea) 04/30/2014   She desires bilateral tubal ligation for contraception.  She plans to breastfeed  Prenatal labs and studies: ABO, Rh: A/Positive/-- (04/13 1035) Antibody: Negative (04/13 1035) Rubella: 2.31 (04/13 1035) RPR: Non Reactive (08/02 0932)  HBsAg: Negative (04/13 1035)  HIV: Non Reactive (08/02 0932)  GBS: Negative/-- (09/28 1327)  2hr Glucola: negative Genetic screening: normal Anatomy US: normal  Past Medical History:  Past Medical History:  Diagnosis Date   Headache     Past Surgical History:  Past Surgical History:  Procedure Laterality Date   CESAREAN SECTION     PANNICULECTOMY      Obstetrical History:  OB History     Gravida  4   Para  1   Term  1   Preterm  0   AB  2   Living  1      SAB  2   IAB  0   Ectopic  0   Multiple  0   Live Births  1           Gynecological History:  OB History     Gravida  4   Para  1   Term  1   Preterm  0   AB  2   Living  1      SAB  2   IAB  0   Ectopic  0   Multiple  0   Live Births   1           Social History:  Social History   Socioeconomic History   Marital status: Single    Spouse name: Not on file   Number of children: Not on file   Years of education: Not on file   Highest education level: Not on file  Occupational History   Not on file  Tobacco Use   Smoking status: Former    Types: Cigarettes    Passive exposure: Never   Smokeless tobacco: Never  Vaping Use   Vaping Use: Never used  Substance and Sexual Activity   Alcohol use: Not Currently   Drug use: Not Currently    Types: Marijuana    Comment: last use of weed aug 2023   Sexual activity: Not Currently    Birth control/protection: None  Other Topics Concern   Not on file  Social History Narrative   Not on file   Social Determinants of Health   Financial Resource Strain: Not on file  Food Insecurity: No Food Insecurity (12/19/2021)   Hunger Vital Sign    Worried About Running Out of Food in the Last Year: Never   true    Ran Out of Food in the Last Year: Never true  Transportation Needs: No Transportation Needs (12/19/2021)   PRAPARE - Administrator, Civil Service (Medical): No    Lack of Transportation (Non-Medical): No  Physical Activity: Not on file  Stress: Not on file  Social Connections: Not on file    Family History:  Family History  Problem Relation Age of Onset   Hypertension Mother    Cancer Mother    Asthma Neg Hx    Birth defects Neg Hx    Diabetes Neg Hx    Heart disease Neg Hx    Stroke Neg Hx     Medications:  Prenatal vitamins,  No current facility-administered medications for this encounter.    Allergies:  Allergies  Allergen Reactions   Latex Itching    Review of Systems: -  negative  Physical Exam: Blood pressure 120/76, pulse 87, temperature 98.1 F (36.7 C), temperature source Oral, resp. rate 19, height 5\' 2"  (1.575 m), weight 99.6 kg, last menstrual period 08/27/2021, SpO2 100 %. GENERAL: Well-developed, well-nourished female in  no acute distress.  LUNGS: Clear to auscultation bilaterally.  HEART: Regular rate and rhythm. ABDOMEN: Soft, nontender, nondistended, gravid. EXTREMITIES: Nontender, no edema, 2+ distal pulses. Presentation: cephalic FHT:  Baseline rate 140 bpm   Variability moderate  Accelerations present   Decelerations none Contractions: none   Pertinent Labs/Studies:   Lab Results  Component Value Date   WBC 9.0 03/29/2022   HGB 11.9 03/29/2022   HCT 35.6 03/29/2022   MCV 92 03/29/2022   PLT 289 03/29/2022    Assessment : Heather Clements is a 41 y.o. G4P1021 at [redacted]w[redacted]d being admitted for cesarean section with tubal ligation secondary to elective repeat, nonreassuring fetal testing.  Plan: The risks of cesarean section discussed with the patient included but were not limited to: bleeding which may require transfusion or reoperation; infection which may require antibiotics; injury to bowel, bladder, ureters or other surrounding organs; injury to the fetus; need for additional procedures including hysterectomy in the event of a life-threatening hemorrhage; placental abnormalities wth subsequent pregnancies, incisional problems, thromboembolic phenomenon and other postoperative/anesthesia complications. The patient concurred with the proposed plan, giving informed written consent for the procedure.   Patient has been NPO since 8 AM and will remain NPO for procedure.  Preoperative prophylactic Ancef ordered on call to the OR.    [redacted]w[redacted]d, DO 06/06/2022, 12:27 PM

## 2022-06-06 NOTE — MAU Note (Signed)
RN called lab and spoke to [facility] [place] concerning status of labs sent down at 1200. Type and screen that was sent down at the same time with admission labs has already been received and resulted. Celesta Gentile reported that the type and screen was received but the rest of the admission labs are unable to be located. Phlebotomy called to redraw labs.

## 2022-06-06 NOTE — Procedures (Signed)
Heather Clements 1980/11/20 [redacted]w[redacted]d  Fetus A Non-Stress Test Interpretation for 06/06/22  Indication: Unsatisfactory BPP  Fetal Heart Rate A Mode: External Baseline Rate (A): 150 bpm Variability: Moderate Accelerations: 15 x 15 Decelerations: None Multiple birth?: No  Uterine Activity Mode: Toco Contraction Frequency (min): 1 u/c during NST Contraction Duration (sec): 90 Contraction Quality: Mild Resting Tone Palpated: Relaxed Resting Time: Adequate  Interpretation (Fetal Testing) Nonstress Test Interpretation: Reactive Overall Impression: Reassuring for gestational age Comments: tracing reviewed by Dr. Epimenio Sarin

## 2022-06-06 NOTE — Progress Notes (Signed)
Pt reports blurred vision but states its been going on since may of her pregnancy. Denies any headaches, nor RUQ pain.

## 2022-06-06 NOTE — Anesthesia Preprocedure Evaluation (Addendum)
Anesthesia Evaluation  Patient identified by MRN, date of birth, ID band Patient awake    Reviewed: Allergy & Precautions, Patient's Chart, lab work & pertinent test results  Airway Mallampati: III  TM Distance: >3 FB Neck ROM: Full    Dental no notable dental hx.    Pulmonary sleep apnea , former smoker,    Pulmonary exam normal breath sounds clear to auscultation       Cardiovascular hypertension, Pt. on medications Normal cardiovascular exam Rhythm:Regular Rate:Normal     Neuro/Psych  Headaches, negative psych ROS   GI/Hepatic negative GI ROS, Neg liver ROS,   Endo/Other  Morbid obesitybmi  40  Renal/GU negative Renal ROS  negative genitourinary   Musculoskeletal negative musculoskeletal ROS (+)   Abdominal (+) + obese,   Peds negative pediatric ROS (+)  Hematology negative hematology ROS (+)   Anesthesia Other Findings   Reproductive/Obstetrics (+) Pregnancy Prior section x1 desires sterility                             Anesthesia Physical Anesthesia Plan  ASA: 3  Anesthesia Plan: Spinal   Post-op Pain Management: Regional block*, Ofirmev IV (intra-op)* and Toradol IV (intra-op)*   Induction:   PONV Risk Score and Plan: 3 and Ondansetron, Dexamethasone and Treatment may vary due to age or medical condition  Airway Management Planned: Natural Airway  Additional Equipment: None  Intra-op Plan:   Post-operative Plan:   Informed Consent:   Plan Discussed with:   Anesthesia Plan Comments:         Anesthesia Quick Evaluation

## 2022-06-06 NOTE — Transfer of Care (Addendum)
Immediate Anesthesia Transfer of Care Note  Patient: Heather Clements  Procedure(s) Performed: CESAREAN SECTION WITH BILATERAL TUBAL LIGATION (Abdomen)  Patient Location: PACU  Anesthesia Type:Spinal  Level of Consciousness: awake, alert , and oriented  Airway & Oxygen Therapy: Patient Spontanous Breathing  Post-op Assessment: Report given to RN and Post -op Vital signs reviewed and stable  Post vital signs: Reviewed and stable  Last Vitals:  Vitals Value Taken Time  BP    Temp    Pulse    Resp    SpO2      Last Pain:  Vitals:   06/06/22 1120  TempSrc: Oral         Complications: No notable events documented.

## 2022-06-06 NOTE — Anesthesia Procedure Notes (Signed)
Spinal  Patient location during procedure: OR Start time: 06/06/2022 3:10 PM End time: 06/06/2022 3:16 PM Reason for block: surgical anesthesia Staffing Performed: anesthesiologist  Anesthesiologist: Pervis Hocking, DO Performed by: Pervis Hocking, DO Authorized by: Pervis Hocking, DO   Preanesthetic Checklist Completed: patient identified, IV checked, risks and benefits discussed, surgical consent, monitors and equipment checked, pre-op evaluation and timeout performed Spinal Block Patient position: sitting Prep: DuraPrep and site prepped and draped Patient monitoring: cardiac monitor, continuous pulse ox and blood pressure Approach: midline Location: L3-4 Injection technique: single-shot Needle Needle type: Pencan  Needle gauge: 24 G Needle length: 9 cm Assessment Sensory level: T6 Events: CSF return Additional Notes Functioning IV was confirmed and monitors were applied. Sterile prep and drape, including hand hygiene and sterile gloves were used. The patient was positioned and the spine was prepped. The skin was anesthetized with lidocaine.  Free flow of clear CSF was obtained prior to injecting local anesthetic into the CSF.  The spinal needle aspirated freely following injection.  The needle was carefully withdrawn.  The patient tolerated the procedure well.   Performed by srna under direct supervision

## 2022-06-06 NOTE — Progress Notes (Signed)
MFM Consult Note Patient Name: Heather Clements  Patient MRN:   [id number]  Referring provider: [facility]  Reason for Consult: BPP 6/10    HPI: La SIMEONE is a 41 y.o. [id number] at [redacted]w[redacted]d  here for a BPP for CHTN on Procardia, AMA and obesity.  BPP was 6/10 today (-2 fluid, and movement). I discussed the need for delivery given her gestational age to reduce the risk of stillbirth. She last ate around 8 am and drank a red soda around 9am. I have discussed the need to remain NPO and promptly go to the hospital for delivery when appropriate.   Sonographic findings Single intrauterine pregnancy. Observed fetal cardiac activity. Cephalic presentation. Interval fetal anatomy appears normal.  Amniotic fluid volume: Oligohydramnios. AFI: 5.1 cm.  MVP: 2.6 cm but not 2 cm wide.  Placenta is Anterior. BPP is 6/10.   Review of Systems: A review of systems was performed and was negative except per HPI   Vitals and Physical Exam See intake sheet for vitals Sitting comfortably on the sonogram table Nonlabored breathing Normal rate and rhythm Abdomen is nontender  Assessment - BPP 6/10 (-2 fluid, and movement) - Chronic hypertension on medication - AMA - Obesity  Plan - To the hospital for delivery via repeat CD - I have called Dr. Nehemiah Settle (OB on call) who will notify the MAU  I spent 25 minutes reviewing the patients chart, including labs and images as well as counseling the patient about her medical conditions.  Valeda Malm  MFM, [place]   06/06/2022  9:36 AM

## 2022-06-07 ENCOUNTER — Encounter (HOSPITAL_COMMUNITY): Payer: Self-pay | Admitting: Family Medicine

## 2022-06-07 LAB — CBC
HCT: 35.3 % — ABNORMAL LOW (ref 36.0–46.0)
Hemoglobin: 11.6 g/dL — ABNORMAL LOW (ref 12.0–15.0)
MCH: 30.7 pg (ref 26.0–34.0)
MCHC: 32.9 g/dL (ref 30.0–36.0)
MCV: 93.4 fL (ref 80.0–100.0)
Platelets: 284 10*3/uL (ref 150–400)
RBC: 3.78 MIL/uL — ABNORMAL LOW (ref 3.87–5.11)
RDW: 14.3 % (ref 11.5–15.5)
WBC: 18.1 10*3/uL — ABNORMAL HIGH (ref 4.0–10.5)
nRBC: 0 % (ref 0.0–0.2)

## 2022-06-07 LAB — RPR: RPR Ser Ql: NONREACTIVE

## 2022-06-07 MED ORDER — FUROSEMIDE 20 MG PO TABS
20.0000 mg | ORAL_TABLET | Freq: Every day | ORAL | Status: DC
  Administered 2022-06-07 – 2022-06-08 (×2): 20 mg via ORAL
  Filled 2022-06-07 (×2): qty 1

## 2022-06-07 NOTE — Anesthesia Postprocedure Evaluation (Signed)
Anesthesia Post Note  Patient: Heather Clements  Procedure(s) Performed: CESAREAN SECTION WITH BILATERAL TUBAL LIGATION (Abdomen)     Patient location during evaluation: PACU Anesthesia Type: Spinal Level of consciousness: awake and alert and oriented Pain management: pain level controlled Vital Signs Assessment: post-procedure vital signs reviewed and stable Respiratory status: spontaneous breathing, nonlabored ventilation and respiratory function stable Cardiovascular status: blood pressure returned to baseline and stable Postop Assessment: no headache, no backache, spinal receding and no apparent nausea or vomiting Anesthetic complications: no   No notable events documented.  Last Vitals:  Vitals:   06/06/22 2031 06/07/22 0402  BP: (!) 140/82 125/79  Pulse: 75 72  Resp: 18 16  Temp: (!) 36.4 C 36.7 C  SpO2:      Last Pain:  Vitals:   06/07/22 0402  TempSrc: Oral                 Pervis Hocking

## 2022-06-07 NOTE — Clinical Social Work Maternal (Addendum)
CLINICAL SOCIAL WORK MATERNAL/CHILD NOTE   Patient Details  Name: Heather Clements MRN: [id number] Date of Birth: 04/15/1981   Date:  06/07/2022   Clinical Social Worker Initiating Note:  Soren Lazarz, LCSWA   Date/Time: Initiated:  06/07/22/1353              Child's Name:  Heather Clements    Biological Parents:  Mother, Father (Heather Clements 12/13/1978, Heather Clements 01/04/80)    Need for Interpreter:  None    Reason for Referral:  Current Substance Use/Substance Use During Pregnancy      Address:  [address] [place] [address]    Phone number:  [phone] (home)      Additional phone number:    Household Members/Support Persons (HM/SP):   Household Member/Support Person 1, Household Member/Support Person 2     HM/SP Name Relationship DOB or Age  HM/SP -1 Heather Clements significant other 01/04/80  HM/SP -2 Heather Clements son 01/30/2014  HM/SP -3     HM/SP -4     HM/SP -5     HM/SP -6     HM/SP -7     HM/SP -8         Natural Supports (not living in the home):  Parent    Professional Supports:      Employment: Full-time    Type of Work:      Education:  Vocation/technical training    Homebound arranged:     Financial Resources:  Medicaid    Other Resources:  WIC    Cultural/Religious Considerations Which May Impact Care:     Strengths:  Ability to meet basic needs  , Compliance with medical plan  , Pediatrician chosen    Psychotropic Medications:          Pediatrician:    [place] area   Pediatrician List:    [facility] ([address])  [place]   [place]   [place]   [place]   [place]       Pediatrician Fax Number:     Risk Factors/Current Problems:  Substance Use      Cognitive State:  Able to Concentrate  , Alert      Mood/Affect:  Comfortable  , Calm      CSW Assessment: CSW entered the room, introduced self CSW role and reason for visit. MOB was agreeable to visit.  CSW inquired about how MOB was feeling, MOB reported good and her delivery was great. CSW inquired about MH concerns, MOB reported none. CSW assessed for safety, MOB denied any SI, HI or DV. MOB identified her parents as her supports. CSW provided education regarding the baby blues period vs. perinatal mood disorders, discussed treatment and gave resources for mental health follow up if concerns arise.  CSW recommends self-evaluation during the postpartum time period using the New Mom Checklist from Postpartum Progress and encouraged MOB to contact a medical professional if symptoms are noted at any time. CSW inquired about additional resources, MOB reported she has all necessary items for the infant including bassinet for him to sleep.     CSW inquired about noted THC use, MOB explained she last used in August, MOB reported she used consistently throughout her pernancy to help her sleep. CSW explained the hospital drug screen policy, MOB voiced understanding. CSW explained if infants CDS or UDS are positive for any substances a CPS report would be made, MOB voiced understanding. No further concerns, no   CSW Will Continue to Monitor Umbilical Cord Tissue Drug Screen Results and Make Report if Warranted, Child Protective Service Report  , [place], [place] 06/07/2022, 1:58 PM

## 2022-06-07 NOTE — Progress Notes (Signed)
POSTPARTUM PROGRESS NOTE  Post Partum Day 1  Subjective:  Heather Clements is a 41 y.o. [id number] s/p RCS and BTL  at [redacted]w[redacted]d.  No acute events overnight.  Pt denies problems with ambulating, voiding or po intake.  She denies nausea or vomiting.  Pain is well controlled.  She has had flatus. She has not had bowel movement.  Lochia Small.   Objective: Blood pressure 125/79, pulse 72, temperature 98 F (36.7 C), temperature source Oral, resp. rate 16, height 5\' 2"  (1.575 m), weight 99.6 kg, last menstrual period 08/27/2021, SpO2 100 %, unknown if currently breastfeeding.  Physical Exam:  General: alert, cooperative and no distress Chest: no respiratory distress Heart:regular rate, distal pulses intact Abdomen: soft, nontender,  Uterine Fundus: firm, appropriately tender DVT Evaluation: No calf swelling or tenderness Extremities: No edema Skin: warm, dry; incision clean/dry/intact, light blood around base of honeycomb - well affixed   Recent Labs    06/06/22 1238 06/07/22 0423  HGB 13.2 11.6*  HCT 38.4 35.3*    Assessment/Plan: Alfred Eckley is a 41 y.o. G2R4270 s/p CS & BTL at [redacted]w[redacted]d   PPD#1 - Doing well, New leukocytosis but afebrile and asymptomatic will monitor today and repeat CBC AM for resolution.  Circumision: Consented, plan for today pending peds approval (skin tag on foreskin of infant, further review) Contraception: BTL Feeding: breast/bottle3 Dispo: Plan for discharge tomorrow.   LOS: 1 day   Deloria Lair, DO, CNM 06/07/2022, 7:44 AM

## 2022-06-07 NOTE — Lactation Note (Signed)
This note was copied from a baby's chart. Lactation Consultation Note  Patient Name: Heather Clements Date: 06/07/2022 Reason for consult: Follow-up assessment;Early term 37-38.6wks Age:43 hours LC attempted to see mom earlier and she was in BR.  Came back and is resting finally she stated w/o baby being on her breast. The baby has been cluster feeding all day. Baby's out put is amazing. Mom stated she other child didn't BF this much. Newborn feeding habits reviewed again. Encouraged mom to call for assistance or questions. Praised mom for great BF.  Maternal Data    Feeding    LATCH Score                    Lactation Tools Discussed/Used    Interventions    Discharge    Consult Status Consult Status: Follow-up Date: 06/08/22 Follow-up type: In-patient    Theodoro Kalata 06/07/2022, 9:50 PM

## 2022-06-08 ENCOUNTER — Other Ambulatory Visit (HOSPITAL_COMMUNITY): Payer: Self-pay

## 2022-06-08 ENCOUNTER — Encounter: Payer: Medicaid Other | Admitting: Obstetrics and Gynecology

## 2022-06-08 LAB — SURGICAL PATHOLOGY

## 2022-06-08 MED ORDER — IBUPROFEN 600 MG PO TABS
600.0000 mg | ORAL_TABLET | Freq: Four times a day (QID) | ORAL | 0 refills | Status: DC
  Filled 2022-06-08: qty 30, 8d supply, fill #0

## 2022-06-08 MED ORDER — FUROSEMIDE 20 MG PO TABS
20.0000 mg | ORAL_TABLET | Freq: Every day | ORAL | 0 refills | Status: DC
  Filled 2022-06-08: qty 4, 4d supply, fill #0

## 2022-06-08 MED ORDER — OXYCODONE HCL 5 MG PO TABS
5.0000 mg | ORAL_TABLET | Freq: Four times a day (QID) | ORAL | 0 refills | Status: AC | PRN
  Filled 2022-06-08: qty 10, 3d supply, fill #0

## 2022-06-08 NOTE — Lactation Note (Signed)
This note was copied from a baby's chart. Lactation Consultation Note  Patient Name: Heather Clements FYBOF'B Date: 06/08/2022 Reason for consult: Follow-up assessment;Early term 37-38.6wks;Infant weight loss;Difficult latch;Nipple pain/trauma;Breastfeeding assistance (post circ, 6 % weight loss/ DL on the right breast due to areola edema and larger nipple for baby's mouth) Age:43 hours Switched to the left breast and the areola compressible to obtain a deep latch, multiple swallows, per mom comfortable, and baby still feeding at 10 mins.  MBURN Raford Pitcher aware to chart total time.  LC recommended to mom since the baby can't latch on the right breast to use the hand pump , coconut oil and eventually the areola will soften for a latch.  LC reviewed BF d/C teaching and [place] resources after D/C.  Maternal Data Has patient been taught Hand Expression?: Yes  Feeding Mother's Current Feeding Choice: Breast Milk  LATCH Score Latch: Grasps breast easily, tongue down, lips flanged, rhythmical sucking.  Audible Swallowing: Spontaneous and intermittent  Type of Nipple: Everted at rest and after stimulation  Comfort (Breast/Nipple): Soft / non-tender  Hold (Positioning): Assistance needed to correctly position infant at breast and maintain latch.  LATCH Score: 9   Lactation Tools Discussed/Used Tools: Pump;Flanges;Coconut oil Flange Size: 24;27 Breast pump type: Manual Pump Education: Setup, frequency, and cleaning;Milk Storage Reason for Pumping: Lsee above  Interventions Interventions: Breast feeding basics reviewed;Assisted with latch;Skin to skin;Hand express;Support pillows;Breast compression;Position options;Hand pump;Education  Discharge Discharge Education: Engorgement and breast care;Warning signs for feeding baby Pump: Manual;Personal  Consult Status Consult Status: Complete Date: 06/08/22    Myer Haff 06/08/2022, 2:31 PM

## 2022-06-08 NOTE — Discharge Instructions (Signed)
Take off the honeycomb dressing 5 days after surgery as specified.  2.  Follow-up outpatient in 1 week at the office to check on your incision and in 4 to 6 weeks, as scheduled for your postpartum visit.    3.  Take Tylenol 1000 mg and ibuprofen 600 mg scheduled times a day for the next few days, to help with your pain.  You can also take oxycodone as needed for breakthrough pain.  4.  Please call if you start to experience increased vaginal bleeding requiring a change of pads every 2 hours, sudden onset severe headache, right abdominal pain, flashes of light in a vision or any concerns for new elevated blood pressure.

## 2022-06-09 ENCOUNTER — Other Ambulatory Visit (HOSPITAL_COMMUNITY)
Admission: RE | Admit: 2022-06-09 | Discharge: 2022-06-09 | Disposition: A | Discharge status: Home / Self Care | Payer: Medicaid Other | Source: Ambulatory Visit | Attending: Obstetrics and Gynecology | Admitting: Obstetrics and Gynecology

## 2022-06-12 ENCOUNTER — Inpatient Hospital Stay (HOSPITAL_COMMUNITY): Admit: 2022-06-12 | Payer: Medicaid Other | Admitting: Family Medicine

## 2022-06-12 DIAGNOSIS — O0990 Supervision of high risk pregnancy, unspecified, unspecified trimester: Secondary | ICD-10-CM

## 2022-06-12 DIAGNOSIS — Z98891 History of uterine scar from previous surgery: Secondary | ICD-10-CM

## 2022-06-14 ENCOUNTER — Telehealth: Payer: Self-pay | Admitting: *Deleted

## 2022-06-14 ENCOUNTER — Ambulatory Visit: Payer: Medicaid Other

## 2022-06-14 NOTE — Telephone Encounter (Signed)
Called pt and left VM message stating I am calling in reference to a missed appointment today @ 9:00 for incision check. The importance of this appointment was stated and pt was asked to call the office to reschedule.

## 2022-06-15 ENCOUNTER — Encounter: Payer: Medicaid Other | Admitting: Obstetrics & Gynecology

## 2022-06-16 ENCOUNTER — Telehealth (HOSPITAL_COMMUNITY): Payer: Self-pay | Admitting: *Deleted

## 2022-06-16 NOTE — Telephone Encounter (Signed)
Left phone voicemail message.  Odis Hollingshead, RN 06-16-2022 at 11:46am

## 2022-06-22 ENCOUNTER — Other Ambulatory Visit: Payer: Medicaid Other

## 2022-06-22 ENCOUNTER — Encounter: Payer: Medicaid Other | Admitting: Family Medicine

## 2022-06-28 ENCOUNTER — Ambulatory Visit (INDEPENDENT_AMBULATORY_CARE_PROVIDER_SITE_OTHER): Payer: Medicaid Other

## 2022-06-28 ENCOUNTER — Inpatient Hospital Stay (HOSPITAL_COMMUNITY)
Admission: AD | Admit: 2022-06-28 | Discharge: 2022-06-30 | DRG: 776 | Disposition: A | Discharge status: Home / Self Care | Payer: Medicaid Other | Attending: Obstetrics and Gynecology | Admitting: Obstetrics and Gynecology

## 2022-06-28 ENCOUNTER — Other Ambulatory Visit: Payer: Self-pay

## 2022-06-28 ENCOUNTER — Encounter (HOSPITAL_COMMUNITY): Payer: Self-pay | Admitting: Obstetrics and Gynecology

## 2022-06-28 VITALS — BP 180/110 | HR 72 | Wt 201.5 lb

## 2022-06-28 DIAGNOSIS — Z0130 Encounter for examination of blood pressure without abnormal findings: Secondary | ICD-10-CM

## 2022-06-28 DIAGNOSIS — O99215 Obesity complicating the puerperium: Secondary | ICD-10-CM | POA: Diagnosis present

## 2022-06-28 DIAGNOSIS — O115 Pre-existing hypertension with pre-eclampsia, complicating the puerperium: Principal | ICD-10-CM | POA: Diagnosis present

## 2022-06-28 DIAGNOSIS — G8918 Other acute postprocedural pain: Secondary | ICD-10-CM

## 2022-06-28 DIAGNOSIS — O1495 Unspecified pre-eclampsia, complicating the puerperium: Principal | ICD-10-CM | POA: Diagnosis present

## 2022-06-28 DIAGNOSIS — Z6837 Body mass index (BMI) 37.0-37.9, adult: Secondary | ICD-10-CM

## 2022-06-28 DIAGNOSIS — O99213 Obesity complicating pregnancy, third trimester: Secondary | ICD-10-CM | POA: Diagnosis present

## 2022-06-28 DIAGNOSIS — O99335 Smoking (tobacco) complicating the puerperium: Secondary | ICD-10-CM | POA: Diagnosis present

## 2022-06-28 DIAGNOSIS — O1003 Pre-existing essential hypertension complicating the puerperium: Secondary | ICD-10-CM | POA: Diagnosis present

## 2022-06-28 DIAGNOSIS — Z5189 Encounter for other specified aftercare: Secondary | ICD-10-CM

## 2022-06-28 DIAGNOSIS — Z419 Encounter for procedure for purposes other than remedying health state, unspecified: Secondary | ICD-10-CM | POA: Diagnosis not present

## 2022-06-28 LAB — COMPREHENSIVE METABOLIC PANEL
ALT: 11 U/L (ref 0–44)
AST: 12 U/L — ABNORMAL LOW (ref 15–41)
Albumin: 3.7 g/dL (ref 3.5–5.0)
Alkaline Phosphatase: 112 U/L (ref 38–126)
Anion gap: 7 (ref 5–15)
BUN: 10 mg/dL (ref 6–20)
CO2: 24 mmol/L (ref 22–32)
Calcium: 8.9 mg/dL (ref 8.9–10.3)
Chloride: 110 mmol/L (ref 98–111)
Creatinine, Ser: 0.8 mg/dL (ref 0.44–1.00)
GFR, Estimated: >60 mL/min (ref >60)
Glucose, Bld: 91 mg/dL (ref 70–99)
Potassium: 4 mmol/L (ref 3.5–5.1)
Sodium: 141 mmol/L (ref 135–145)
Total Bilirubin: 0.5 mg/dL (ref 0.3–1.2)
Total Protein: 6.4 g/dL — ABNORMAL LOW (ref 6.5–8.1)

## 2022-06-28 LAB — CBC
HCT: 42.9 % (ref 36.0–46.0)
Hemoglobin: 14.1 g/dL (ref 12.0–15.0)
MCH: 30.5 pg (ref 26.0–34.0)
MCHC: 32.9 g/dL (ref 30.0–36.0)
MCV: 92.7 fL (ref 80.0–100.0)
Platelets: 386 10*3/uL (ref 150–400)
RBC: 4.63 MIL/uL (ref 3.87–5.11)
RDW: 13.4 % (ref 11.5–15.5)
WBC: 5.6 10*3/uL (ref 4.0–10.5)
nRBC: 0 % (ref 0.0–0.2)

## 2022-06-28 MED ORDER — LABETALOL HCL 5 MG/ML IV SOLN
40.0000 mg | INTRAVENOUS | Status: DC | PRN
  Administered 2022-06-28: 40 mg via INTRAVENOUS
  Filled 2022-06-28: qty 8

## 2022-06-28 MED ORDER — HYDROCHLOROTHIAZIDE 25 MG PO TABS
25.0000 mg | ORAL_TABLET | Freq: Every day | ORAL | Status: DC
  Administered 2022-06-28 – 2022-06-30 (×3): 25 mg via ORAL
  Filled 2022-06-28 (×3): qty 1

## 2022-06-28 MED ORDER — MAGNESIUM SULFATE BOLUS VIA INFUSION
4.0000 g | Freq: Once | INTRAVENOUS | Status: AC
  Administered 2022-06-28: 4 g via INTRAVENOUS
  Filled 2022-06-28: qty 1000

## 2022-06-28 MED ORDER — LABETALOL HCL 5 MG/ML IV SOLN: 20.0000 mg | INTRAVENOUS | Status: DC | PRN

## 2022-06-28 MED ORDER — LABETALOL HCL 5 MG/ML IV SOLN: 80.0000 mg | INTRAVENOUS | Status: DC | PRN

## 2022-06-28 MED ORDER — LACTATED RINGERS IV SOLN: INTRAVENOUS | Status: DC

## 2022-06-28 MED ORDER — HYDRALAZINE HCL 20 MG/ML IJ SOLN
10.0000 mg | INTRAMUSCULAR | Status: DC | PRN
  Administered 2022-06-28: 10 mg via INTRAVENOUS
  Filled 2022-06-28: qty 1

## 2022-06-28 MED ORDER — ZOLPIDEM TARTRATE 5 MG PO TABS: 5.0000 mg | ORAL_TABLET | Freq: Every evening | ORAL | Status: DC | PRN

## 2022-06-28 MED ORDER — MAGNESIUM SULFATE 40 GM/1000ML IV SOLN
2.0000 g/h | INTRAVENOUS | Status: AC
  Administered 2022-06-29: 2 g/h via INTRAVENOUS
  Filled 2022-06-28 (×2): qty 1000

## 2022-06-28 MED ORDER — HYDRALAZINE HCL 20 MG/ML IJ SOLN: 10.0000 mg | INTRAMUSCULAR | Status: DC | PRN

## 2022-06-28 MED ORDER — OXYCODONE HCL 5 MG PO TABS
10.0000 mg | ORAL_TABLET | Freq: Once | ORAL | Status: AC
  Administered 2022-06-28: 10 mg via ORAL
  Filled 2022-06-28: qty 2

## 2022-06-28 MED ORDER — NIFEDIPINE ER OSMOTIC RELEASE 30 MG PO TB24
30.0000 mg | ORAL_TABLET | Freq: Every day | ORAL | Status: DC
  Administered 2022-06-28 – 2022-06-30 (×3): 30 mg via ORAL
  Filled 2022-06-28 (×3): qty 1

## 2022-06-28 MED ORDER — ACETAMINOPHEN 500 MG PO TABS
1000.0000 mg | ORAL_TABLET | Freq: Four times a day (QID) | ORAL | Status: DC | PRN
  Administered 2022-06-28 – 2022-06-29 (×3): 1000 mg via ORAL
  Filled 2022-06-28 (×3): qty 2

## 2022-06-28 MED ORDER — LACTATED RINGERS IV SOLN: INTRAVENOUS | Status: AC

## 2022-06-28 MED ORDER — LABETALOL HCL 5 MG/ML IV SOLN: 40.0000 mg | INTRAVENOUS | Status: DC | PRN

## 2022-06-28 MED ORDER — LABETALOL HCL 5 MG/ML IV SOLN
20.0000 mg | INTRAVENOUS | Status: DC | PRN
  Administered 2022-06-28: 20 mg via INTRAVENOUS
  Filled 2022-06-28: qty 4

## 2022-06-28 MED ORDER — LABETALOL HCL 5 MG/ML IV SOLN
80.0000 mg | INTRAVENOUS | Status: DC | PRN
  Administered 2022-06-28: 80 mg via INTRAVENOUS
  Filled 2022-06-28: qty 16

## 2022-06-28 MED ORDER — IBUPROFEN 600 MG PO TABS
600.0000 mg | ORAL_TABLET | Freq: Four times a day (QID) | ORAL | Status: DC | PRN
  Administered 2022-06-28 – 2022-06-29 (×2): 600 mg via ORAL
  Filled 2022-06-28 (×2): qty 1

## 2022-06-28 NOTE — Progress Notes (Signed)
Blood Pressure Check Visit  Heather Clements is here for blood pressure check following repeat c-section on 06/06/22. History of chronic hypertension. Was on nifedipine 30 mg daily during pregnancy. BP was WNL prior to discharge after c-sections; not on any BP med currently. BP today is 195/117, recheck 10 minutes later is 180/110. Denies any s/s of elevated BP at this time. Reports headache yesterday that resolved with Tylenol. Reports BP at home yesterday taken by home visiting RN was 160/100. Reviewed with Ajewole, who recommends patient go immediately to MAU. Report given via secure chat to MAU providers.  Annabell Howells, RN 06/28/2022  10:05 AM

## 2022-06-28 NOTE — MAU Note (Signed)
Pt unsure about admission at this time. Staets she needs to discuss with FOB. Magnesium infusion held. Pt to alert RN via call bell when she has decided. Julianne Handler CNM aware.

## 2022-06-28 NOTE — H&P (Signed)
OBSTETRIC ADMISSION HISTORY AND PHYSICAL  Heather Clements is a 41 y.o. female [phone] s/p CS 3 weeks ago with CHTN presenting from office for severe BPs. She is not on BP medication, was taking Procardia during pregnancy. Denies HA, visual disturbances, RUQ pain, SOB, and CP. She was evaluated in MAU and required Labetalol x3 doses and Hydralazine x1 dose to reduce BP to mild range.  Prenatal History/Complications: -CHTN -previous CS -obesity  Past Medical History: Past Medical History:  Diagnosis Date   Headache     Past Surgical History: Past Surgical History:  Procedure Laterality Date   CESAREAN SECTION     CESAREAN SECTION WITH BILATERAL TUBAL LIGATION N/A 06/06/2022   Procedure: CESAREAN SECTION WITH BILATERAL TUBAL LIGATION;  Surgeon: Levie Heritage, DO;  Location: [facility] LD [facility]S;  Service: Obstetrics;  Laterality: N/A;   PANNICULECTOMY      Obstetrical History: OB History     Gravida  4   Para  2   Term  2   Preterm  0   AB  2   Living  2      SAB  2   IAB  0   Ectopic  0   Multiple  0   Live Births  2           Social History: Social History   Socioeconomic History   Marital status: Single    Spouse name: Not on file   Number of children: Not on file   Years of education: Not on file   Highest education level: Not on file  Occupational History   Not on file  Tobacco Use   Smoking status: Every Day    Packs/day: 0.20    Types: Cigarettes    Passive exposure: Never   Smokeless tobacco: Never  Vaping Use   Vaping Use: Never used  Substance and Sexual Activity   Alcohol use: Yes   Drug use: Not Currently    Types: Marijuana    Comment: last use of weed aug 2023   Sexual activity: Not Currently    Birth control/protection: None  Other Topics Concern   Not on file  Social History Narrative   Not on file   Social Determinants of Health   Financial Resource Strain: Not on file  Food Insecurity: No Food Insecurity (06/07/2022)   Hunger  Vital Sign    Worried About Running Out of Food in the Last Year: Never true    Ran Out of Food in the Last Year: Never true  Transportation Needs: No Transportation Needs (06/07/2022)   PRAPARE - Administrator, Civil Service (Medical): No    Lack of Transportation (Non-Medical): No  Physical Activity: Not on file  Stress: Not on file  Social Connections: Not on file    Family History: Family History  Problem Relation Age of Onset   Hypertension Mother    Cancer Mother    Asthma Neg Hx    Birth defects Neg Hx    Diabetes Neg Hx    Heart disease Neg Hx    Stroke Neg Hx     Allergies: Allergies  Allergen Reactions   Latex Itching    Medications Prior to Admission  Medication Sig Dispense Refill Last Dose   acetaminophen (TYLENOL) 500 MG tablet Take 1,000 mg by mouth every 6 (six) hours as needed.   06/27/2022   ibuprofen (ADVIL) 600 MG tablet Take 1 tablet (600 mg total) by mouth every 6 (six) hours.  30 tablet 0 Past Week   oxyCODONE (OXY IR/ROXICODONE) 5 MG immediate release tablet Take 1 tablet (5 mg total) by mouth every 6 (six) hours as needed for breakthrough pain or severe pain. 10 tablet 0 Past Week   Prenatal Vit-Fe Fumarate-FA (PREPLUS) 27-1 MG TABS Take 1 tablet by mouth daily. 30 tablet 11 Past Week     Review of Systems:  All systems reviewed and negative except as stated in HPI  PE: Blood pressure (!) 162/96, pulse 72, temperature 98.1 F (36.7 C), temperature source Oral, resp. rate 16, height 5\' 2"  (1.575 m), weight 91.5 kg, SpO2 100 %, currently breastfeeding.  Patient Vitals for the past 24 hrs:  BP Temp Temp src Pulse Resp SpO2 Height Weight  06/28/22 1321 (!) 136/101 -- -- 89 -- -- -- --  06/28/22 1311 (!) 144/85 -- -- 86 -- -- -- --  06/28/22 1301 (!) 140/79 -- -- 77 -- -- -- --  06/28/22 1251 (!) 145/87 -- -- 85 -- -- -- --  06/28/22 1241 (!) 145/81 -- -- 72 -- -- -- --  06/28/22 1231 (!) 162/96 -- -- 72 -- -- -- --  06/28/22 1221  (!) 143/83 -- -- 76 -- -- -- --  06/28/22 1211 (!) 173/92 -- -- 65 -- -- -- --  06/28/22 1200 (!) 174/102 -- -- 63 -- -- -- --  06/28/22 1150 (!) 174/99 -- -- 73 -- -- -- --  06/28/22 1132 (!) 190/106 98.1 F (36.7 C) Oral 60 16 100 % -- --  06/28/22 1124 -- -- -- -- -- -- 5\' 2"  (1.575 m) 91.5 kg   Constitutional:      General: She is not in acute distress (tearful).    Appearance: Normal appearance.  HENT:     Head: Normocephalic and atraumatic.  Cardiovascular:     Rate and Rhythm: Normal rate.  Pulmonary:     Effort: Pulmonary effort is normal. No respiratory distress.  Musculoskeletal:        General: Normal range of motion.     Cervical back: Normal range of motion.  Neurological:     General: No focal deficit present.     Mental Status: She is alert and oriented to person, place, and time.  Psychiatric:        Mood and Affect: Mood normal.        Behavior: Behavior normal.    Results for orders placed or performed during the hospital encounter of 06/28/22 (from the past 24 hour(s))  Comprehensive metabolic panel     Status: Abnormal   Collection Time: 06/28/22 11:41 AM  Result Value Ref Range   Sodium 141 135 - 145 mmol/L   Potassium 4.0 3.5 - 5.1 mmol/L   Chloride 110 98 - 111 mmol/L   CO2 24 22 - 32 mmol/L   Glucose, Bld 91 70 - 99 mg/dL   BUN 10 6 - 20 mg/dL   Creatinine, Ser 13/01/23 0.44 - 1.00 mg/dL   Calcium 8.9 8.9 - 13/01/23 mg/dL   Total Protein 6.4 (L) 6.5 - 8.1 g/dL   Albumin 3.7 3.5 - 5.0 g/dL   AST 12 (L) 15 - 41 U/L   ALT 11 0 - 44 U/L   Alkaline Phosphatase 112 38 - 126 U/L   Total Bilirubin 0.5 0.3 - 1.2 mg/dL   GFR, Estimated 2.22 97.9 mL/min   Anion gap 7 5 - 15  CBC     Status: None   Collection Time:  06/28/22 11:41 AM  Result Value Ref Range   WBC 5.6 4.0 - 10.5 K/uL   RBC 4.63 3.87 - 5.11 MIL/uL   Hemoglobin 14.1 12.0 - 15.0 g/dL   HCT 42.9 36.0 - 46.0 %   MCV 92.7 80.0 - 100.0 fL   MCH 30.5 26.0 - 34.0 pg   MCHC 32.9 30.0 - 36.0 g/dL   RDW  13.4 11.5 - 15.5 %   Platelets 386 150 - 400 K/uL   nRBC 0.0 0.0 - 0.2 %    Patient Active Problem List   Diagnosis Date Noted   Encounter for maternal care for low transverse scar from repeat cesarean delivery 06/06/2022   Status post cesarean delivery 06/06/2022   Status post repeat low transverse cesarean section 06/06/2022   Status post tubal ligation at time of delivery, current hosp 06/06/2022   Unwanted fertility 05/25/2022   History of cesarean delivery 12/19/2021   Supervision of high risk pregnancy, antepartum 12/08/2021   AMA (advanced maternal age) multigravida 35+ 12/08/2021   Chronic hypertension affecting pregnancy 12/08/2021   Migraine with aura and without status migrainosus, not intractable 05/23/2018   OSA (obstructive sleep apnea) 04/30/2014   Assessment: CHTN w/si PEC, severe   Plan: Admit to [facility] unit Mg Mngt per Dr. Jeannie Fend, CNM  06/28/2022, 12:39 PM

## 2022-06-28 NOTE — MAU Note (Signed)
Heather Clements is a 41 y.o. here in MAU reporting: was in the office today for BP check and it was elevated so they sent her to MAU for eval. No headache or visual changes. No RUQ pain.   Onset of complaint: today  Pain score: 0/10  Vitals:   06/28/22 1132  BP: (!) 190/106  Pulse: 60  Resp: 16  Temp: 98.1 F (36.7 C)  SpO2: 100%     Lab orders placed from triage: none

## 2022-06-28 NOTE — MAU Note (Signed)
Pt agreeable to admission. Report given to Overly and transported to room 107 via wheelchair

## 2022-06-29 DIAGNOSIS — Z6837 Body mass index (BMI) 37.0-37.9, adult: Secondary | ICD-10-CM

## 2022-06-29 DIAGNOSIS — O99335 Smoking (tobacco) complicating the puerperium: Secondary | ICD-10-CM | POA: Diagnosis not present

## 2022-06-29 DIAGNOSIS — O99215 Obesity complicating the puerperium: Secondary | ICD-10-CM | POA: Diagnosis not present

## 2022-06-29 DIAGNOSIS — O115 Pre-existing hypertension with pre-eclampsia, complicating the puerperium: Secondary | ICD-10-CM | POA: Diagnosis not present

## 2022-06-29 DIAGNOSIS — O1003 Pre-existing essential hypertension complicating the puerperium: Secondary | ICD-10-CM | POA: Diagnosis not present

## 2022-06-29 DIAGNOSIS — O1495 Unspecified pre-eclampsia, complicating the puerperium: Secondary | ICD-10-CM | POA: Diagnosis not present

## 2022-06-29 DIAGNOSIS — O99213 Obesity complicating pregnancy, third trimester: Secondary | ICD-10-CM | POA: Diagnosis present

## 2022-06-29 MED ORDER — ENOXAPARIN SODIUM 40 MG/0.4ML IJ SOSY
40.0000 mg | PREFILLED_SYRINGE | INTRAMUSCULAR | Status: DC
  Administered 2022-06-29 – 2022-06-30 (×2): 40 mg via SUBCUTANEOUS
  Filled 2022-06-29 (×2): qty 0.4

## 2022-06-29 MED ORDER — ONDANSETRON 4 MG PO TBDP: 4.0000 mg | ORAL_TABLET | Freq: Four times a day (QID) | ORAL | Status: DC | PRN

## 2022-06-29 MED ORDER — BUTALBITAL-APAP-CAFFEINE 50-325-40 MG PO TABS
1.0000 | ORAL_TABLET | Freq: Once | ORAL | Status: AC
  Administered 2022-06-29: 1 via ORAL
  Filled 2022-06-29: qty 1

## 2022-06-29 NOTE — Progress Notes (Addendum)
Daily Postpartum Note  Admission Date: 06/28/2022 Current Date: 06/29/2022 7:22 AM  Heather Clements is a 41 y.o. [id number] readmit for severe pre-eclampsia (BPs, HA) s/p rpt c/s and BTL on 10/10.  Pregnancy complicated by: Patient Active Problem List   Diagnosis Date Noted   BMI 37.0-37.9, adult 06/29/2022   Obesity affecting pregnancy in third trimester 06/29/2022   Pre-eclampsia, postpartum 06/28/2022   Encounter for maternal care for low transverse scar from repeat cesarean delivery 06/06/2022   Status post cesarean delivery 06/06/2022   Status post repeat low transverse cesarean section 06/06/2022   Status post tubal ligation at time of delivery, current hosp 06/06/2022   Unwanted fertility 05/25/2022   History of cesarean delivery 12/19/2021   Supervision of high risk pregnancy, antepartum 12/08/2021   AMA (advanced maternal age) multigravida 35+ 12/08/2021   Chronic hypertension affecting pregnancy 12/08/2021   Migraine with aura and without status migrainosus, not intractable 05/23/2018   OSA (obstructive sleep apnea) 04/30/2014    Overnight/24hr events:  HA improved with oxycodone but had itching  Subjective:  Tired. Some frontal pressure but feels HA is much improved.   Objective:    Current Vital Signs 24h Vital Sign Ranges  T 97.9 F (36.6 C) Temp  Avg: 98.1 F (36.7 C)  Min: 97.9 F (36.6 C)  Max: 98.4 F (36.9 C)  BP (!) 142/69 BP  Min: 121/73  Max: 195/117  HR 78 Pulse  Avg: 77.5  Min: 60  Max: 89  RR 18 Resp  Avg: 16.9  Min: 16  Max: 18  SaO2 100 % Room Air SpO2  Avg: 99.7 %  Min: 98 %  Max: 100 %       24 Hour I/O Current Shift I/O  Time Ins Outs 11/01 0701 - 11/02 0700 In: 3273.3 [P.O.:1800; I.V.:1473.3] Out: 3100 [Urine:2800] No intake/output data recorded.   Patient Vitals for the past 24 hrs:  BP Temp Temp src Pulse Resp SpO2 Height Weight  06/29/22 0605 -- -- -- -- 18 -- -- --  06/29/22 0520 -- -- -- -- 17 -- -- --  06/29/22 0415 -- -- -- -- 18 -- --  --  06/29/22 0304 (!) 142/69 97.9 F (36.6 C) Oral 78 18 100 % -- --  06/29/22 0000 -- -- -- -- 16 -- -- --  06/28/22 2303 135/81 -- -- 87 16 100 % -- --  06/28/22 2215 138/69 -- -- 78 16 100 % -- --  06/28/22 2055 (!) 146/80 97.9 F (36.6 C) Oral 83 17 100 % -- --  06/28/22 2000 (!) 147/89 -- Oral 74 18 100 % -- --  06/28/22 1900 137/83 -- -- 80 17 100 % -- --  06/28/22 1810 -- -- -- -- -- 100 % -- --  06/28/22 1805 -- -- -- -- -- 100 % -- --  06/28/22 1800 128/71 -- -- 81 16 100 % -- --  06/28/22 1755 -- -- -- -- -- 100 % -- --  06/28/22 1750 -- -- -- -- -- 99 % -- --  06/28/22 1745 -- -- -- -- -- 99 % -- --  06/28/22 1740 -- -- -- -- -- 100 % -- --  06/28/22 1735 -- -- -- -- -- 100 % -- --  06/28/22 1725 -- -- -- -- -- 100 % -- --  06/28/22 1720 -- -- -- -- -- 100 % -- --  06/28/22 1715 -- -- -- -- -- 99 % -- --  06/28/22 1710 -- -- -- -- --  100 % -- --  06/28/22 1705 -- -- -- -- -- 98 % -- --  06/28/22 1700 121/73 -- -- 84 16 98 % -- --  06/28/22 1655 -- -- -- -- -- 100 % -- --  06/28/22 1650 -- -- -- -- -- 99 % -- --  06/28/22 1645 -- -- -- -- -- 98 % -- --  06/28/22 1640 -- -- -- -- -- 100 % -- --  06/28/22 1635 -- -- -- -- -- 99 % -- --  06/28/22 1630 -- -- -- -- -- 100 % -- --  06/28/22 1625 -- -- -- -- -- 99 % -- --  06/28/22 1620 -- -- -- -- -- 99 % -- --  06/28/22 1615 -- -- -- -- -- 99 % -- --  06/28/22 1610 -- -- -- -- -- 100 % -- --  06/28/22 1605 -- -- -- -- -- 100 % -- --  06/28/22 1600 134/74 -- -- 84 17 100 % -- --  06/28/22 1555 -- -- -- -- -- 100 % -- --  06/28/22 1550 -- -- -- -- -- 100 % -- --  06/28/22 1545 -- -- -- -- -- 100 % -- --  06/28/22 1540 -- -- -- -- -- 100 % -- --  06/28/22 1535 -- -- -- -- -- 100 % -- --  06/28/22 1531 138/82 -- -- 80 -- -- -- --  06/28/22 1530 -- -- -- -- -- 100 % -- --  06/28/22 1500 -- -- -- -- 17 -- -- --  06/28/22 1411 (!) 140/72 98.4 F (36.9 C) Oral 84 17 100 % -- --  06/28/22 1321 (!) 136/101 -- -- 89 -- -- --  --  06/28/22 1311 (!) 144/85 -- -- 86 -- -- -- --  06/28/22 1301 (!) 140/79 -- -- 77 -- -- -- --  06/28/22 1251 (!) 145/87 -- -- 85 -- -- -- --  06/28/22 1241 (!) 145/81 -- -- 72 -- -- -- --  06/28/22 1231 (!) 162/96 -- -- 72 -- -- -- --  06/28/22 1221 (!) 143/83 -- -- 76 -- -- -- --  06/28/22 1211 (!) 173/92 -- -- 65 -- -- -- --  06/28/22 1200 (!) 174/102 -- -- 63 -- -- -- --  06/28/22 1150 (!) 174/99 -- -- 73 -- -- -- --  06/28/22 1132 (!) 190/106 98.1 F (36.7 C) Oral 60 16 100 % -- --  06/28/22 1124 -- -- -- -- -- -- 5\' 2"  (1.575 m) 91.5 kg   Physical exam: General: Well nourished, well developed female in no acute distress. Abdomen: nttp Cardiovascular: S1, S2 normal, no murmur, rub or gallop, regular rate and rhythm Respiratory: CTAB Extremities: no clubbing, cyanosis or edema Skin: Warm and dry.   Medications: Current Facility-Administered Medications  Medication Dose Route Frequency Provider Last Rate Last Admin   acetaminophen (TYLENOL) tablet 1,000 mg  1,000 mg Oral Q6H PRN , MD   1,000 mg at 06/29/22 0310   butalbital-acetaminophen-caffeine (FIORICET) 50-325-40 MG per tablet 1 tablet  1 tablet Oral Once 13/02/23, MD       labetalol (NORMODYNE) injection 20 mg  20 mg Intravenous PRN [place] Bing, MD       And   labetalol (NORMODYNE) injection 40 mg  40 mg Intravenous PRN Hermina Staggers, MD       And   labetalol (NORMODYNE) injection 80 mg  80 mg Intravenous PRN Hermina Staggers, MD  And   hydrALAZINE (APRESOLINE) injection 10 mg  10 mg Intravenous PRN Hermina Staggers, MD       hydrochlorothiazide (HYDRODIURIL) tablet 25 mg  25 mg Oral Daily Hermina Staggers, MD   25 mg at 06/28/22 1520   ibuprofen (ADVIL) tablet 600 mg  600 mg Oral Q6H PRN Hermina Staggers, MD   600 mg at 06/28/22 2047   lactated ringers infusion   Intravenous Continuous [place] Bing, MD 50 mL/hr at 06/28/22 1503 New Bag at 06/28/22 1503   lactated ringers  infusion   Intravenous Continuous Hermina Staggers, MD       magnesium sulfate 40 grams in SWI 1000 mL OB infusion  2 g/hr Intravenous Continuous [place] Bing, MD 50 mL/hr at 06/28/22 1530 2 g/hr at 06/28/22 1530   NIFEdipine (PROCARDIA-XL/NIFEDICAL-XL) 24 hr tablet 30 mg  30 mg Oral Daily Hermina Staggers, MD   30 mg at 06/28/22 1532   ondansetron (ZOFRAN-ODT) disintegrating tablet 4 mg  4 mg Oral Q6H PRN  Bing, MD       zolpidem (AMBIEN) tablet 5 mg  5 mg Oral QHS PRN Hermina Staggers, MD        Labs:  Recent Labs  Lab 06/28/22 1141  WBC 5.6  HGB 14.1  HCT 42.9  PLT 386    Recent Labs  Lab 06/28/22 1141  NA 141  K 4.0  CL 110  CO2 24  BUN 10  CREATININE 0.80  CALCIUM 8.9  PROT 6.4*  BILITOT 0.5  ALKPHOS 112  ALT 11  AST 12*  GLUCOSE 91   Radiology:  none  Assessment & Plan:  Patient improved *Postpartum: routine care *Severe pre-x: Mg until 1300. Continue meds. Fioricet x 1 ordered. Pressure likely due to Mg.  *PPx: lovenox ordered *FEN/GI: regular diet. MIVF with Mg *Dispo: likely tomorrow morning. Meds to beds tomorrow.   Cornelia Copa MD Attending Center for [facility] (Faculty Practice) GYN Consult Phone: [phone] (M-F, [phone]) & [phone]  (Off hours, weekends, holidays)

## 2022-06-29 NOTE — Plan of Care (Signed)
  Problem: Education: Goal: Knowledge of disease or condition will improve Outcome: Completed/Met Goal: Knowledge of the prescribed therapeutic regimen will improve Outcome: Completed/Met   Problem: Education: Goal: Knowledge of General Education information will improve Description: Including pain rating scale, medication(s)/side effects and non-pharmacologic comfort measures Outcome: Completed/Met   Problem: Activity: Goal: Risk for activity intolerance will decrease Outcome: Completed/Met   Problem: Nutrition: Goal: Adequate nutrition will be maintained Outcome: Completed/Met   Problem: Coping: Goal: Level of anxiety will decrease Outcome: Completed/Met   Problem: Elimination: Goal: Will not experience complications related to urinary retention Outcome: Completed/Met   Problem: Education: Goal: Knowledge of disease or condition will improve Outcome: Completed/Met Goal: Knowledge of the prescribed therapeutic regimen will improve Outcome: Completed/Met   Problem: Education: Goal: Knowledge of General Education information will improve Description: Including pain rating scale, medication(s)/side effects and non-pharmacologic comfort measures Outcome: Completed/Met   Problem: Activity: Goal: Risk for activity intolerance will decrease Outcome: Completed/Met   Problem: Nutrition: Goal: Adequate nutrition will be maintained Outcome: Completed/Met

## 2022-06-30 ENCOUNTER — Other Ambulatory Visit (HOSPITAL_COMMUNITY): Payer: Self-pay

## 2022-06-30 DIAGNOSIS — O1495 Unspecified pre-eclampsia, complicating the puerperium: Secondary | ICD-10-CM

## 2022-06-30 MED ORDER — IBUPROFEN 600 MG PO TABS
600.0000 mg | ORAL_TABLET | Freq: Four times a day (QID) | ORAL | 0 refills | Status: AC | PRN
  Filled 2022-06-30: qty 30, 8d supply, fill #0

## 2022-06-30 MED ORDER — NIFEDIPINE ER 30 MG PO TB24
30.0000 mg | ORAL_TABLET | Freq: Every day | ORAL | 1 refills | Status: AC
  Filled 2022-06-30: qty 30, 30d supply, fill #0

## 2022-06-30 MED ORDER — HYDROCHLOROTHIAZIDE 25 MG PO TABS
25.0000 mg | ORAL_TABLET | Freq: Every day | ORAL | 0 refills | Status: AC
  Filled 2022-06-30: qty 5, 5d supply, fill #0

## 2022-06-30 NOTE — Discharge Summary (Signed)
Physician Discharge Summary  Patient ID: Suhayla Chisom MRN: [id number] DOB/AGE: [birth year] 41 y.o.  Admit date: 06/28/2022 Discharge date: 06/30/2022  Admission Diagnoses: Postpartum Preeclampsia  Discharge Diagnoses:  Principal Problem:   Pre-eclampsia, postpartum Active Problems:   BMI 37.0-37.9, adult   Obesity affecting pregnancy in third trimester   Discharged Condition: good  Hospital Course: Ms Jasko was admitted for postpartum preeclampsia. See admit H & P for additional information. She was placed on magnesium x 24 hrs and started on antihypertensive medications. She responded well. BP normalized and felt amendable for discharge home  Consults: None  Significant Diagnostic Studies: labs:  Treatments: IV hydration and magnesium x 24 hours  Discharge Exam: Blood pressure 127/79, pulse 92, temperature 97.8 F (36.6 C), temperature source Oral, resp. rate 18, height 5\' 2"  (1.575 m), weight 91.5 kg, SpO2 100 %, currently breastfeeding. Lungs clear Heart RRR Abd soft, + BS Ext non tender  Disposition: Discharge disposition: 01-Home or Self Care       Discharge Instructions     Call MD for:  difficulty breathing, headache or visual disturbances   Complete by: As directed    Call MD for:  extreme fatigue   Complete by: As directed    Call MD for:  hives   Complete by: As directed    Call MD for:  persistant dizziness or light-headedness   Complete by: As directed    Call MD for:  persistant nausea and vomiting   Complete by: As directed    Call MD for:  redness, tenderness, or signs of infection (pain, swelling, redness, odor or green/yellow discharge around incision site)   Complete by: As directed    Call MD for:  severe uncontrolled pain   Complete by: As directed    Call MD for:  temperature >100.4   Complete by: As directed    Diet - low sodium heart healthy   Complete by: As directed       Allergies as of 06/30/2022       Reactions   Latex Itching         Medication List     TAKE these medications    acetaminophen 500 MG tablet Commonly known as: TYLENOL Take 1,000 mg by mouth every 6 (six) hours as needed.   hydrochlorothiazide 25 MG tablet Commonly known as: HYDRODIURIL Take 1 tablet (25 mg total) by mouth daily. Start taking on: July 01, 2022   ibuprofen 600 MG tablet Commonly known as: ADVIL Take 1 tablet (600 mg total) by mouth every 6 (six) hours as needed for fever or headache. What changed:  when to take this reasons to take this   NIFEdipine 30 MG 24 hr tablet Commonly known as: ADALAT CC Take 1 tablet (30 mg total) by mouth daily. Start taking on: July 01, 2022   oxyCODONE 5 MG immediate release tablet Commonly known as: Oxy IR/ROXICODONE Take 1 tablet (5 mg total) by mouth every 6 (six) hours as needed for breakthrough pain or severe pain.   PrePLUS 27-1 MG Tabs Take 1 tablet by mouth daily.        Follow-up [place] Follow up.   Specialty: Obstetrics and Gynecology Why: Pt already has f/u appt Contact information: [place] [id number] [phone]                Signed: Chancy Milroy 06/30/2022, 10:28 AM

## 2022-07-03 ENCOUNTER — Telehealth: Payer: Self-pay | Admitting: Licensed Clinical Social Worker

## 2022-07-03 ENCOUNTER — Other Ambulatory Visit: Payer: Self-pay

## 2022-07-03 NOTE — Patient Outreach (Signed)
Transition Care Management Follow-up Telephone Call Date of discharge and from where: 06/30/22 [facility] How have you been since you were released from the hospital? [place], trying to get a schedule down Any questions or concerns? Yes  Items Reviewed: Did the pt receive and understand the discharge instructions provided? Yes  Medications obtained and verified? Yes  Other? No  Any new allergies since your discharge? No  Dietary orders reviewed? No Do you have support at home? Yes   Home Care and Equipment/Supplies: Were home health services ordered? not applicable If so, what is the name of the agency?  Has the agency set up a time to come to the patient's home? not applicable Were any new equipment or medical supplies ordered?  No What is the name of the medical supply agency?  Were you able to get the supplies/equipment? not applicable Do you have any questions related to the use of the equipment or supplies? No  Functional Questionnaire: (I = Independent and D = Dependent) ADLs: I  Bathing/Dressing- I  Meal Prep- I  Eating- I  Maintaining continence- I  Transferring/Ambulation- I  Managing Meds- I  Follow up appointments reviewed:  PCP Hospital f/u appt confirmed? Yes  Scheduled to see  on 07/11/22 @ 9:15. [facility] f/u appt confirmed? No  Scheduled to see  . Are transportation arrangements needed? No  If their condition worsens, is the pt aware to call PCP or go to the Emergency Dept.? Yes Was the patient provided with contact information for the PCP's office or ED? Yes Was to pt encouraged to call back with questions or concerns? Yes Patient stated she was in need of food, [facility] emailed patient a list of food pantries. Marland Kitchenajs

## 2022-07-03 NOTE — Patient Outreach (Addendum)
Erroneous Encounter

## 2022-07-11 ENCOUNTER — Ambulatory Visit: Payer: Medicaid Other | Admitting: Obstetrics and Gynecology

## 2022-07-17 ENCOUNTER — Ambulatory Visit: Payer: Self-pay | Admitting: Obstetrics and Gynecology

## 2022-07-18 NOTE — Progress Notes (Signed)
   Complete physical exam  Patient: Heather Clements   DOB: 06/17/2001   41 y.o. Female  MRN: [id number]  Subjective:    No chief complaint on file.   Heather Clements is a 41 y.o. female who presents today for a complete physical exam. She reports consuming a {diet types:17450} diet. {types:19826} She generally feels {DESC; WELL/FAIRLY WELL/POORLY:18703}. She reports sleeping {DESC; WELL/FAIRLY WELL/POORLY:18703}. She {does/does not:200015} have additional problems to discuss today.    Most recent fall risk assessment:    02/22/2022   10:42 AM  Fall Risk   Falls in the past year? 0  Number falls in past yr: 0  Injury with Fall? 0  Risk for fall due to : No Fall Risks  Follow up Falls evaluation completed     Most recent depression screenings:    02/22/2022   10:42 AM 01/13/2021   10:46 AM  PHQ 2/9 Scores  PHQ - 2 Score 0 0  PHQ- 9 Score 5     {VISON DENTAL STD PSA (Optional):27386}  {History (Optional):23778}  Patient Care Team: Jessup, Joy, NP as PCP - General (Nurse Practitioner)   Outpatient Medications Prior to Visit  Medication Sig   fluticasone (FLONASE) 50 MCG/ACT nasal spray Place 2 sprays into both nostrils in the morning and at bedtime. After 7 days, reduce to once daily.   norgestimate-ethinyl estradiol (SPRINTEC 28) 0.25-35 MG-MCG tablet Take 1 tablet by mouth daily.   Nystatin POWD Apply liberally to affected area 2 times per day   spironolactone (ALDACTONE) 100 MG tablet Take 1 tablet (100 mg total) by mouth daily.   No facility-administered medications prior to visit.    ROS        Objective:     There were no vitals taken for this visit. {Vitals History (Optional):23777}  Physical Exam   No results found for any visits on 03/30/22. {Show previous labs (optional):23779}    Assessment & Plan:    Routine Health Maintenance and Physical Exam  Immunization History  Administered Date(s) Administered   DTaP 08/31/1999, 10/27/1999,  01/05/2000, 09/20/2000, 04/05/2004   Hepatitis A 01/31/2008, 02/05/2009   Hepatitis B 06/18/1999, 07/26/1999, 01/05/2000   HiB (PRP-OMP) 08/31/1999, 10/27/1999, 01/05/2000, 09/20/2000   IPV 08/31/1999, 10/27/1999, 06/25/2000, 04/05/2004   Influenza,inj,Quad PF,6+ Mos 05/08/2014   Influenza-Unspecified 08/07/2012   MMR 06/25/2001, 04/05/2004   Meningococcal Polysaccharide 02/05/2012   Pneumococcal Conjugate-13 09/20/2000   Pneumococcal-Unspecified 01/05/2000, 03/20/2000   Tdap 02/05/2012   Varicella 06/25/2000, 01/31/2008    Health Maintenance  Topic Date Due   HIV Screening  Never done   Hepatitis C Screening  Never done   INFLUENZA VACCINE  03/28/2022   PAP-Cervical Cytology Screening  03/30/2022 (Originally 06/16/2020)   PAP SMEAR-Modifier  03/30/2022 (Originally 06/16/2020)   TETANUS/TDAP  03/30/2022 (Originally 02/04/2022)   HPV VACCINES  Discontinued   COVID-19 Vaccine  Discontinued    Discussed health benefits of physical activity, and encouraged her to engage in regular exercise appropriate for her age and condition.  Problem List Items Addressed This Visit   None Visit Diagnoses     Annual physical exam    -  Primary   Cervical cancer screening       Need for Tdap vaccination          No follow-ups on file.     Joy Jessup, NP   

## 2022-07-28 DIAGNOSIS — Z419 Encounter for procedure for purposes other than remedying health state, unspecified: Secondary | ICD-10-CM | POA: Diagnosis not present

## 2022-09-28 DIAGNOSIS — Z419 Encounter for procedure for purposes other than remedying health state, unspecified: Secondary | ICD-10-CM | POA: Diagnosis not present

## 2022-10-27 DIAGNOSIS — Z419 Encounter for procedure for purposes other than remedying health state, unspecified: Secondary | ICD-10-CM | POA: Diagnosis not present

## 2022-11-27 DIAGNOSIS — Z419 Encounter for procedure for purposes other than remedying health state, unspecified: Secondary | ICD-10-CM | POA: Diagnosis not present

## 2022-12-19 IMAGING — US US OB COMP LESS 14 WK
1 series · 15 of 28 positions shown · non-contrast
Comparison: None available

CLINICAL DATA: A 41-year-old female presents for evaluation of
pregnancy for dating, uncertain dates. Gestational age by last
menstrual cycle is 11 weeks 4 days.

EXAM:
OBSTETRIC <14 WK ULTRASOUND
TECHNIQUE: Transabdominal ultrasound was performed for evaluation of the
gestation as well as the maternal uterus and adnexal regions.

[Series 1: us ob comp less 14 wk · 15 of 39 slices shown]
[im 1/39]
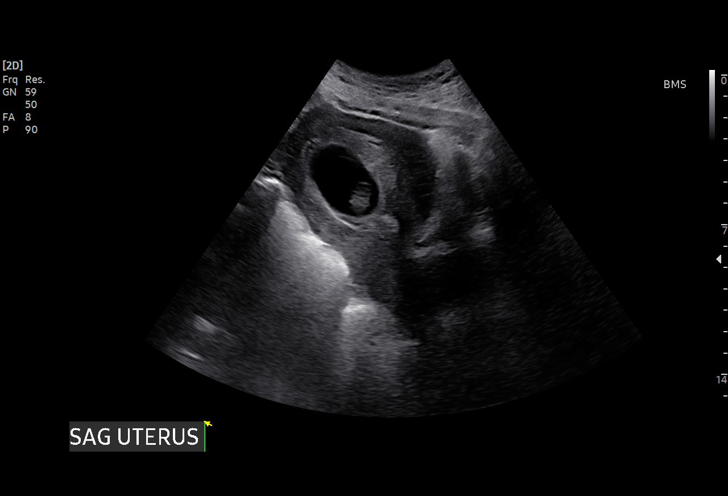
[im 3/39]
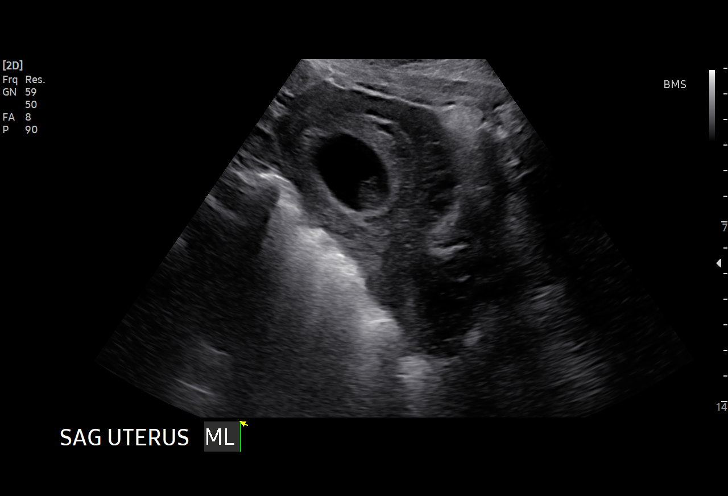
[im 6/39]
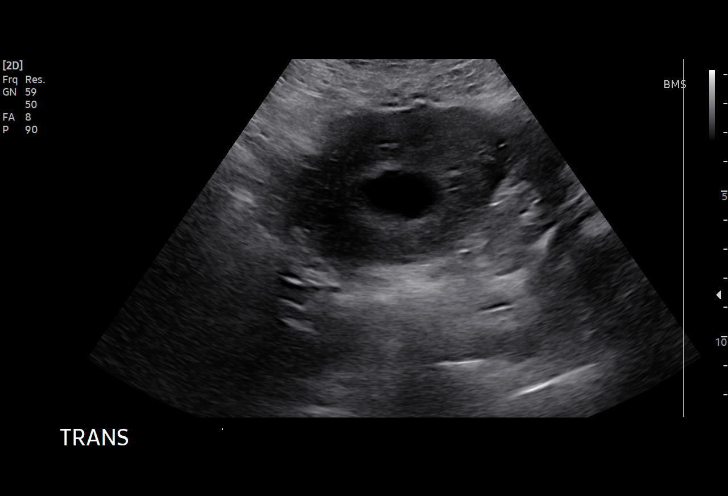
[im 9/39]
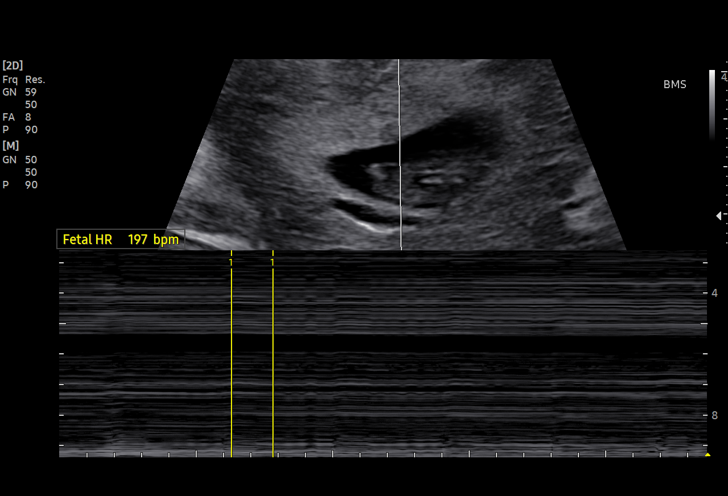
[im 12/39]
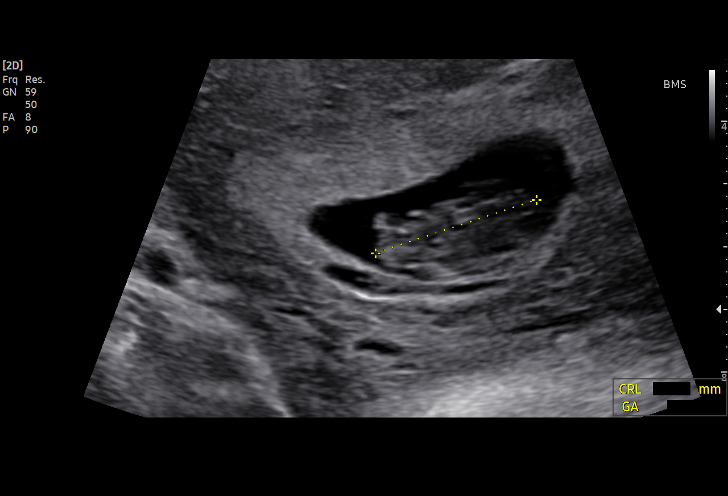
[im 15/39]
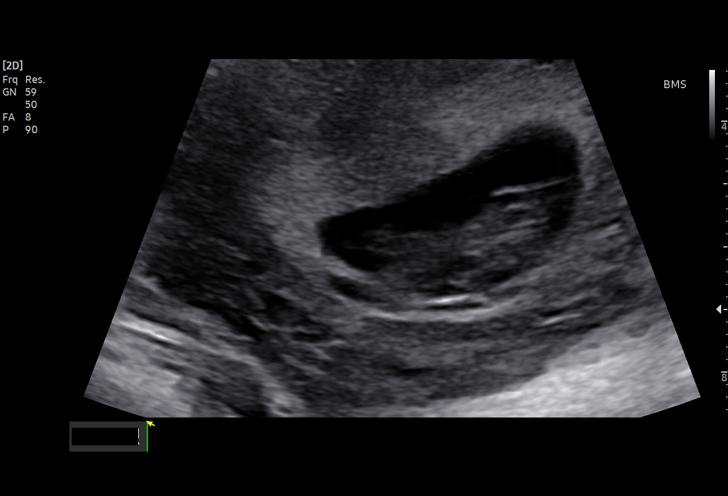
[im 17/39]
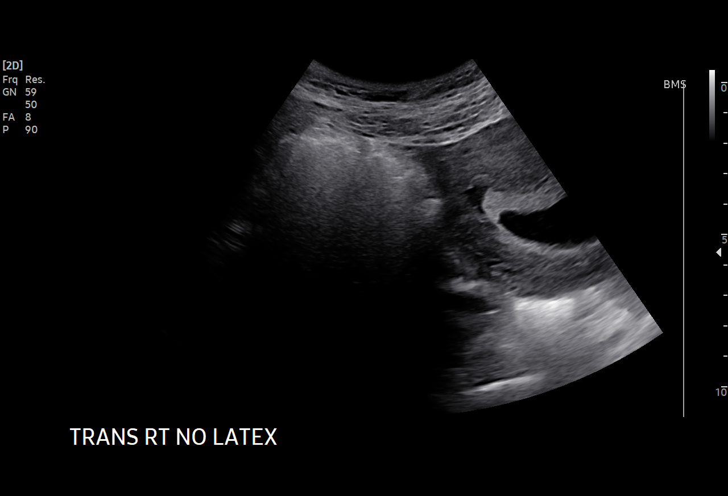
[im 20/39]
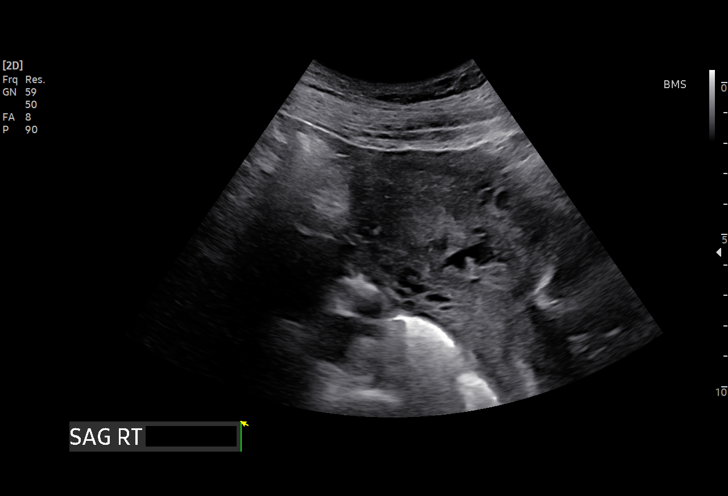
[im 22/39]
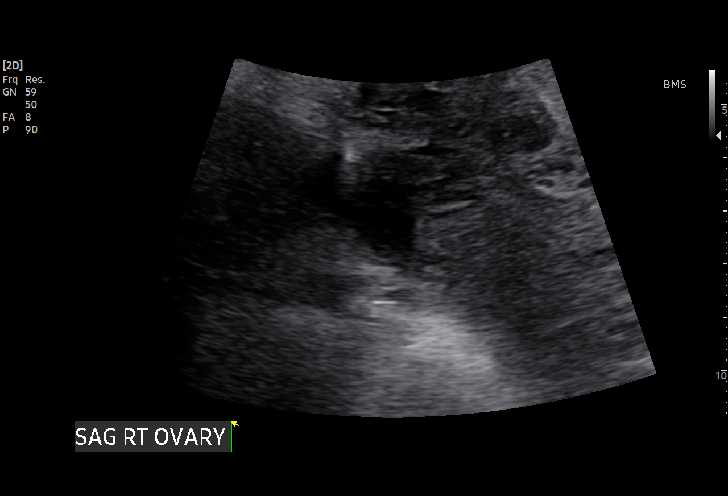
[im 24/39]
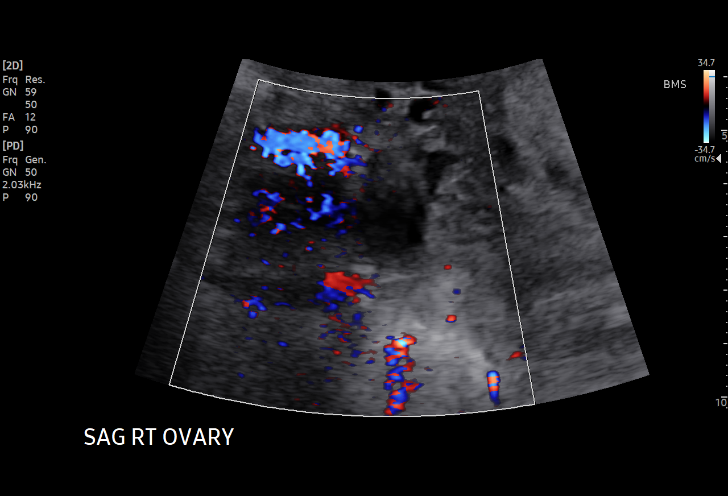
[im 27/39]
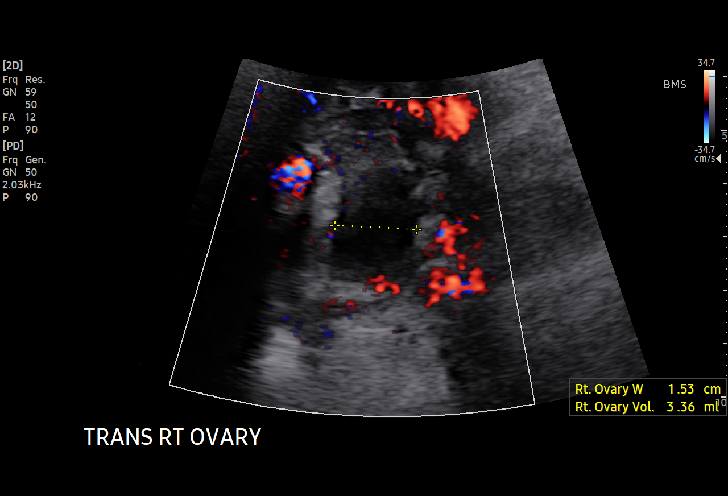
[im 30/39]
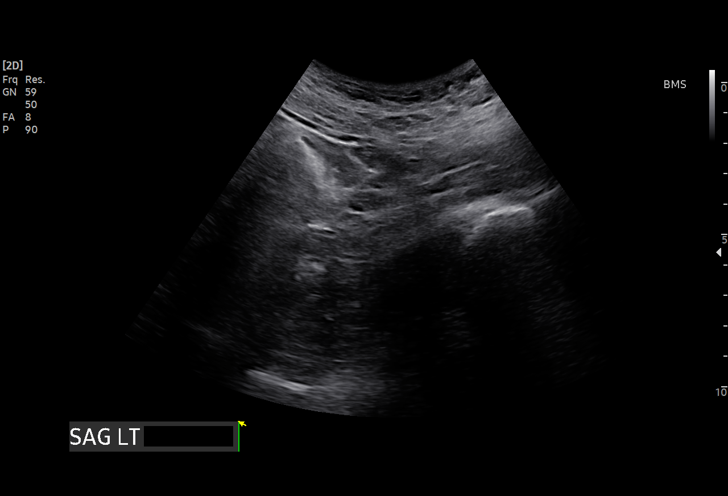
[im 33/39]
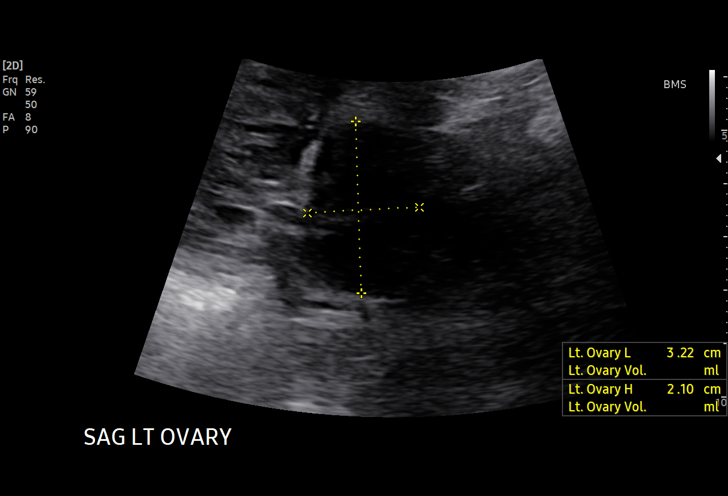
[im 36/39]
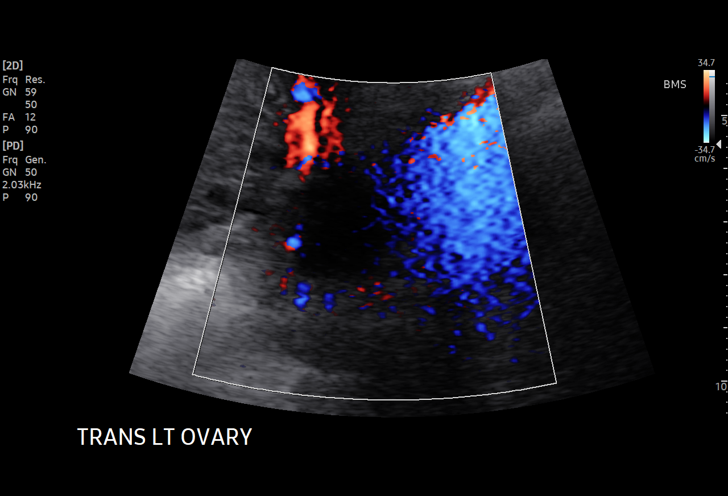
[im 39/39]
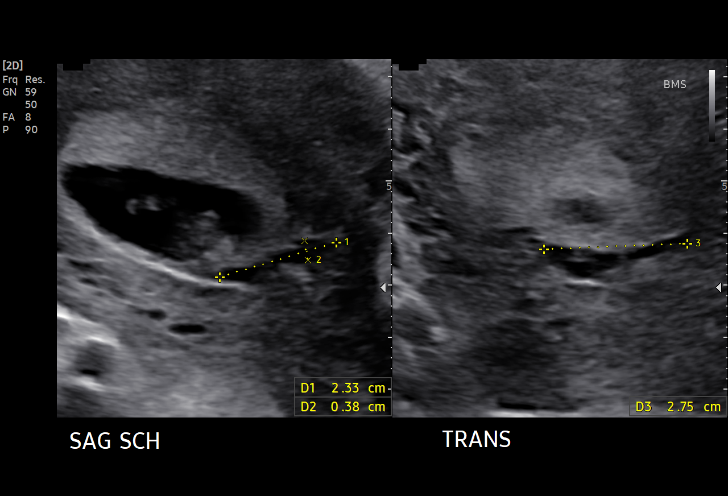

[15 of 28 positions shown; findings below may reference images not displayed]

FINDINGS: Intrauterine gestational sac: Single

Yolk sac:  Not visualized

Embryo:  Visualized

Cardiac Activity: Visualized

Heart Rate: 197 bpm

CRL:   27.4 mm   9 w 4 d                  US EDC: 06/18/2022

Subchorionic hemorrhage:  Small subchorionic hemorrhage.

Maternal uterus/adnexae: Mildly limited assessment of the adnexa due
to transabdominal approach. Grossly normal appearance of the
ovaries. Uterus with gestational sac in the fundus. No free pelvic
fluid
IMPRESSION: Single intrauterine gestation of 9 weeks and 4 days by crown-rump
length with fetal heart rate of 197 and small subchorionic
hemorrhage.

Signs of fetal tachycardia exhibited on today's exam.

## 2022-12-27 DIAGNOSIS — Z419 Encounter for procedure for purposes other than remedying health state, unspecified: Secondary | ICD-10-CM | POA: Diagnosis not present

## 2023-01-27 DIAGNOSIS — Z419 Encounter for procedure for purposes other than remedying health state, unspecified: Secondary | ICD-10-CM | POA: Diagnosis not present

## 2023-02-26 DIAGNOSIS — Z419 Encounter for procedure for purposes other than remedying health state, unspecified: Secondary | ICD-10-CM | POA: Diagnosis not present

## 2023-03-29 DIAGNOSIS — Z419 Encounter for procedure for purposes other than remedying health state, unspecified: Secondary | ICD-10-CM | POA: Diagnosis not present

## 2023-04-29 DIAGNOSIS — Z419 Encounter for procedure for purposes other than remedying health state, unspecified: Secondary | ICD-10-CM | POA: Diagnosis not present

## 2023-05-29 DIAGNOSIS — Z419 Encounter for procedure for purposes other than remedying health state, unspecified: Secondary | ICD-10-CM | POA: Diagnosis not present

## 2023-06-29 DIAGNOSIS — Z419 Encounter for procedure for purposes other than remedying health state, unspecified: Secondary | ICD-10-CM | POA: Diagnosis not present

## 2023-07-29 DIAGNOSIS — Z419 Encounter for procedure for purposes other than remedying health state, unspecified: Secondary | ICD-10-CM | POA: Diagnosis not present

## 2023-08-29 DIAGNOSIS — Z419 Encounter for procedure for purposes other than remedying health state, unspecified: Secondary | ICD-10-CM | POA: Diagnosis not present

## 2023-09-29 DIAGNOSIS — Z419 Encounter for procedure for purposes other than remedying health state, unspecified: Secondary | ICD-10-CM | POA: Diagnosis not present

## 2023-10-27 DIAGNOSIS — Z419 Encounter for procedure for purposes other than remedying health state, unspecified: Secondary | ICD-10-CM | POA: Diagnosis not present

## 2023-12-08 DIAGNOSIS — Z419 Encounter for procedure for purposes other than remedying health state, unspecified: Secondary | ICD-10-CM | POA: Diagnosis not present

## 2024-01-07 DIAGNOSIS — Z419 Encounter for procedure for purposes other than remedying health state, unspecified: Secondary | ICD-10-CM | POA: Diagnosis not present

## 2024-02-07 DIAGNOSIS — Z419 Encounter for procedure for purposes other than remedying health state, unspecified: Secondary | ICD-10-CM | POA: Diagnosis not present

## 2024-03-08 DIAGNOSIS — Z419 Encounter for procedure for purposes other than remedying health state, unspecified: Secondary | ICD-10-CM | POA: Diagnosis not present

## 2024-04-08 DIAGNOSIS — Z419 Encounter for procedure for purposes other than remedying health state, unspecified: Secondary | ICD-10-CM | POA: Diagnosis not present

## 2024-05-09 DIAGNOSIS — Z419 Encounter for procedure for purposes other than remedying health state, unspecified: Secondary | ICD-10-CM | POA: Diagnosis not present

## 2024-08-08 DIAGNOSIS — Z419 Encounter for procedure for purposes other than remedying health state, unspecified: Secondary | ICD-10-CM | POA: Diagnosis not present
